# Patient Record
Sex: Male | Born: 1960 | Race: Black or African American | Hispanic: No | Marital: Married | Smoking: Never smoker
Health system: Southern US, Community
[De-identification: ages and names within clinical notes are randomized; demographics above are authoritative.]

## PROBLEM LIST (undated history)

## (undated) DIAGNOSIS — A159 Respiratory tuberculosis unspecified: Secondary | ICD-10-CM

## (undated) DIAGNOSIS — R7611 Nonspecific reaction to tuberculin skin test without active tuberculosis: Secondary | ICD-10-CM

## (undated) DIAGNOSIS — S62309A Unspecified fracture of unspecified metacarpal bone, initial encounter for closed fracture: Secondary | ICD-10-CM

## (undated) DIAGNOSIS — F419 Anxiety disorder, unspecified: Secondary | ICD-10-CM

## (undated) DIAGNOSIS — T7840XA Allergy, unspecified, initial encounter: Secondary | ICD-10-CM

## (undated) DIAGNOSIS — K219 Gastro-esophageal reflux disease without esophagitis: Secondary | ICD-10-CM

## (undated) DIAGNOSIS — I10 Essential (primary) hypertension: Secondary | ICD-10-CM

## (undated) HISTORY — DX: Essential (primary) hypertension: I10

## (undated) HISTORY — DX: Anxiety disorder, unspecified: F41.9

## (undated) HISTORY — DX: Allergy, unspecified, initial encounter: T78.40XA

---
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
## 2000-05-30 ENCOUNTER — Emergency Department (HOSPITAL_COMMUNITY): Admission: EM | Admit: 2000-05-30 | Discharge: 2000-05-30 | Payer: Self-pay | Admitting: Emergency Medicine

## 2012-08-12 ENCOUNTER — Ambulatory Visit (INDEPENDENT_AMBULATORY_CARE_PROVIDER_SITE_OTHER): Payer: BC Managed Care – PPO | Admitting: Emergency Medicine

## 2012-08-12 VITALS — BP 139/92 | HR 61 | Temp 98.4°F | Resp 16 | Ht 71.0 in | Wt 184.6 lb

## 2012-08-12 DIAGNOSIS — L0211 Cutaneous abscess of neck: Secondary | ICD-10-CM

## 2012-08-12 DIAGNOSIS — L03221 Cellulitis of neck: Secondary | ICD-10-CM

## 2012-08-12 MED ORDER — DOXYCYCLINE HYCLATE 100 MG PO CAPS: 100.0000 mg | ORAL_CAPSULE | Freq: Two times a day (BID) | ORAL | Status: DC

## 2012-08-12 NOTE — Progress Notes (Signed)
[facility] [address] [place] [address] [phone]  Date:  08/12/2012   Name:  Dillon Reeves   DOB:  03/24/1962   MRN:  [id number]  PCP:  No primary provider on file.    Chief Complaint: sore on back of neck   History of Present Illness:  Dillon Reeves is a 52 y.o. very pleasant male patient who presents with the following:  Numerous lesions on the back of his neck that have mostly responded to topical cleocin lotion.  Has one large remaining lesion on the back of his neck.  No drainage.  No fever or chills.  There is no problem list on file for this patient.   Past Medical History  Diagnosis Date  . Allergy   . Anxiety     History reviewed. No pertinent past surgical history.  History  Substance Use Topics  . Smoking status: Never Smoker   . Smokeless tobacco: Not on file  . Alcohol Use: Yes     Comment: once or twice every two weeks    Family History  Problem Relation Age of Onset  . Heart Problems Mother   . Anemia Mother   . Hypertension Brother     No Known Allergies  Medication list has been reviewed and updated.  No current outpatient prescriptions on file prior to visit.   No current facility-administered medications on file prior to visit.    Review of Systems:  As per HPI, otherwise negative.    Physical Examination: Filed Vitals:   08/12/12 1206  BP: 139/92  Pulse: 61  Temp: 98.4 F (36.9 C)  Resp: 16   Filed Vitals:   08/12/12 1206  Height: 5\' 11"  (1.803 m)  Weight: 184 lb 9.6 oz (83.734 kg)   Body mass index is 25.76 kg/(m^2). Ideal Body Weight: Weight in (lb) to have BMI = 25: 178.9   GEN: WDWN, NAD, Non-toxic, Alert & Oriented x 3 HEENT: Atraumatic, Normocephalic.  Ears and Nose: No external deformity. EXTR: No clubbing/cyanosis/edema NEURO: Normal gait.  PSYCH: Normally interactive. Conversant. Not depressed or anxious appearing.  Calm demeanor.  Crusted lesion on the back of  his neck with surrounding lesions.  Assessment and Plan:  Cellulitis neck Doxycycline Follow up 2 weeks  Carmelina Dane, MD

## 2012-08-12 NOTE — Patient Instructions (Signed)
Abscess An abscess is an infected area that contains a collection of pus and debris. It can occur in almost any part of the body. An abscess is also known as a furuncle or boil. CAUSES   An abscess occurs when tissue gets infected. This can occur from blockage of oil or sweat glands, infection of hair follicles, or a minor injury to the skin. As the body tries to fight the infection, pus collects in the area and creates pressure under the skin. This pressure causes pain. People with weakened immune systems have difficulty fighting infections and get certain abscesses more often.   SYMPTOMS Usually an abscess develops on the skin and becomes a painful mass that is red, warm, and tender. If the abscess forms under the skin, you may feel a moveable soft area under the skin. Some abscesses break open (rupture) on their own, but most will continue to get worse without care. The infection can spread deeper into the body and eventually into the bloodstream, causing you to feel ill.   DIAGNOSIS   Your caregiver will take your medical history and perform a physical exam. A sample of fluid may also be taken from the abscess to determine what is causing your infection. TREATMENT   Your caregiver may prescribe antibiotic medicines to fight the infection. However, taking antibiotics alone usually does not cure an abscess. Your caregiver may need to make a small cut (incision) in the abscess to drain the pus. In some cases, gauze is packed into the abscess to reduce pain and to continue draining the area. HOME CARE INSTRUCTIONS    Only take over-the-counter or prescription medicines for pain, discomfort, or fever as directed by your caregiver.   If you were prescribed antibiotics, take them as directed. Finish them even if you start to feel better.   If gauze is used, follow your caregiver's directions for changing the gauze.   To avoid spreading the infection:   Keep your draining abscess covered with a  bandage.   Wash your hands well.   Do not share personal care items, towels, or whirlpools with others.   Avoid skin contact with others.   Keep your skin and clothes clean around the abscess.   Keep all follow-up appointments as directed by your caregiver.  SEEK MEDICAL CARE IF:    You have increased pain, swelling, redness, fluid drainage, or bleeding.   You have muscle aches, chills, or a general ill feeling.   You have a fever.  MAKE SURE YOU:    Understand these instructions.   Will watch your condition.   Will get help right away if you are not doing well or get worse.  Document Released: 03/29/2005 Document Revised: 12/19/2011 Document Reviewed: 09/01/2011 [employer] Patient Information 2013 [employer].    

## 2013-07-28 ENCOUNTER — Telehealth: Payer: Self-pay | Admitting: Family Medicine

## 2013-07-28 MED ORDER — OSELTAMIVIR PHOSPHATE 75 MG PO CAPS: 75.0000 mg | ORAL_CAPSULE | Freq: Two times a day (BID) | ORAL | Status: DC

## 2013-07-28 NOTE — Telephone Encounter (Signed)
Patient with daughter at visit today; daughter tested + for influenza A; pt requesting Tamiflu due to history of double pneumonia.  Pt having sore throat.  A/P: likely influenza A: agreeable to Tamiflu.

## 2014-12-14 ENCOUNTER — Ambulatory Visit (INDEPENDENT_AMBULATORY_CARE_PROVIDER_SITE_OTHER): Payer: BC Managed Care – PPO | Admitting: Emergency Medicine

## 2014-12-14 VITALS — BP 112/80 | HR 70 | Temp 98.3°F | Resp 14 | Ht 71.0 in | Wt 183.0 lb

## 2014-12-14 DIAGNOSIS — H109 Unspecified conjunctivitis: Secondary | ICD-10-CM | POA: Diagnosis not present

## 2014-12-14 DIAGNOSIS — W57XXXA Bitten or stung by nonvenomous insect and other nonvenomous arthropods, initial encounter: Secondary | ICD-10-CM

## 2014-12-14 MED ORDER — POLYMYXIN B-TRIMETHOPRIM 10000-0.1 UNIT/ML-% OP SOLN: 2.0000 [drp] | OPHTHALMIC | Status: DC

## 2014-12-14 MED ORDER — TRIAMCINOLONE ACETONIDE 0.1 % EX CREA
1.0000 | TOPICAL_CREAM | Freq: Two times a day (BID) | CUTANEOUS | Status: DC
Start: 2014-12-14 — End: 2015-01-26

## 2014-12-14 NOTE — Progress Notes (Signed)
Subjective:  Patient ID: Dillon Reeves, male    DOB: January 29, 1961  Age: 54 y.o. MRN: [id number]  CC: Eye Problem and Insect Bite   HPI Dillon Reeves presents  with a 2 day history of redness and swelling in his eye. He has no history of trauma. Has no history of foreign body or foreign body sensation. He has no visual symptoms. All his symptoms involving left eye. Does have some discharge. And some gluing. He said his symptoms been stable and not worsening. He has no history of cough or coryza or fever or chills.  He complains of an exaggerated reaction to "mosquito bites" says that the site of the bite is red and swollen for a protracted period of time. No systemic symptoms such as shortness of breath wheezing or difficulty swallowing or facial swelling. Denies any improvement with over-the-counter medication.  Outpatient Prescriptions Prior to Visit  Medication Sig Dispense Refill  . clindamycin (CLEOCIN T) 1 % lotion Apply topically 2 (two) times daily.    Marland Kitchen doxycycline (VIBRAMYCIN) 100 MG capsule Take 1 capsule (100 mg total) by mouth 2 (two) times daily. (Patient not taking: Reported on 12/14/2014) 28 capsule 0  . Multiple Vitamins-Minerals (MULTIVITAMIN PO) Take 1 tablet by mouth daily.    Marland Kitchen oseltamivir (TAMIFLU) 75 MG capsule Take 1 capsule (75 mg total) by mouth 2 (two) times daily. (Patient not taking: Reported on 12/14/2014) 10 capsule 0   No facility-administered medications prior to visit.    History   Social History  . Marital Status: Single    Spouse Name: N/A  . Number of Children: N/A  . Years of Education: N/A   Social History Main Topics  . Smoking status: Never Smoker   . Smokeless tobacco: Not on file  . Alcohol Use: Yes     Comment: once or twice every two weeks  . Drug Use: No  . Sexual Activity: Yes   Other Topics Concern  . None   Social History Narrative    Family History  Problem Relation Age of Onset  . Heart Problems Mother   .  Anemia Mother   . Hypertension Brother     Past Medical History  Diagnosis Date  . Allergy   . Anxiety      Review of Systems  Constitutional: Negative for fever, chills and appetite change.  HENT: Negative for congestion, ear pain, postnasal drip, sinus pressure and sore throat.   Eyes: Positive for discharge and redness. Negative for pain.  Respiratory: Negative for cough, shortness of breath and wheezing.   Cardiovascular: Negative for leg swelling.  Gastrointestinal: Negative for nausea, vomiting, abdominal pain, diarrhea, constipation and blood in stool.  Endocrine: Negative for polyuria.  Genitourinary: Negative for dysuria, urgency, frequency and flank pain.  Musculoskeletal: Negative for gait problem.  Skin: Negative for rash.  Neurological: Negative for weakness and headaches.  Psychiatric/Behavioral: Negative for confusion and decreased concentration. The patient is not nervous/anxious.     Objective:  BP 112/80 mmHg  Pulse 70  Temp(Src) 98.3 F (36.8 C) (Oral)  Resp 14  Ht 5\' 11"  (1.803 m)  Wt 183 lb (83.008 kg)  BMI 25.53 kg/m2  SpO2 98%  BP Readings from Last 3 Encounters:  12/14/14 112/80  08/12/12 139/92    Wt Readings from Last 3 Encounters:  12/14/14 183 lb (83.008 kg)  08/12/12 184 lb 9.6 oz (83.734 kg)    Physical Exam  Constitutional: He is oriented to person, place, and time.  He appears well-developed and well-nourished.  HENT:  Head: Normocephalic and atraumatic.  Eyes: Pupils are equal, round, and reactive to light. Left eye exhibits discharge. Left eye exhibits no exudate. No foreign body present in the left eye. Left conjunctiva is injected. Left conjunctiva has no hemorrhage. Left eye exhibits normal extraocular motion. Left pupil is round and reactive.  Pulmonary/Chest: Effort normal.  Musculoskeletal: He exhibits no edema.  Neurological: He is alert and oriented to person, place, and time.  Skin: Skin is dry.  Psychiatric: He has a  normal mood and affect. His behavior is normal. Thought content normal.   fluorescein stain was negative and the eye there is no foreign body hyphema or hypopyon  No results found for: WBC, HGB, HCT, PLT, GLUCOSE, CHOL, TRIG, HDL, LDLDIRECT, LDLCALC, ALT, AST, NA, K, CL, CREATININE, BUN, CO2, TSH, PSA, INR, GLUF, HGBA1C, MICROALBUR    .  Assessment & Plan:   Dillon Reeves was seen today for eye problem and insect bite.  Diagnoses and all orders for this visit:  Conjunctivitis of left eye  Insect bite  Other orders -     trimethoprim-polymyxin b (POLYTRIM) ophthalmic solution; Place 2 drops into the left eye every 4 (four) hours. -     triamcinolone cream (KENALOG) 0.1 %; Apply 1 application topically 2 (two) times daily.   I am having Dillon Reeves start on trimethoprim-polymyxin b and triamcinolone cream. I am also having him maintain his Multiple Vitamins-Minerals (MULTIVITAMIN PO), clindamycin, doxycycline, and oseltamivir.  Meds ordered this encounter  Medications  . trimethoprim-polymyxin b (POLYTRIM) ophthalmic solution    Sig: Place 2 drops into the left eye every 4 (four) hours.    Dispense:  10 mL    Refill:  0  . triamcinolone cream (KENALOG) 0.1 %    Sig: Apply 1 application topically 2 (two) times daily.    Dispense:  30 g    Refill:  1    Appropriate red flag conditions were discussed with the patient as well as actions that should be taken.  Patient expressed his understanding.  Follow-up: Return if symptoms worsen or fail to improve.  Roselee Culver, MD

## 2014-12-14 NOTE — Patient Instructions (Signed)

## 2014-12-16 ENCOUNTER — Telehealth: Payer: Self-pay

## 2014-12-16 NOTE — Telephone Encounter (Signed)
Pt states he was diagnosed with pink eye and have been taking the medicine faithfully but doesn't see any improvement in his eyes, would like to know what the recommendation is now Please call [phone]

## 2014-12-17 MED ORDER — OFLOXACIN 0.3 % OP SOLN: 1.0000 [drp] | OPHTHALMIC | Status: DC

## 2014-12-17 NOTE — Telephone Encounter (Signed)
Spoke with pt, advised message from Mario. Pt understood. 

## 2014-12-17 NOTE — Telephone Encounter (Signed)
Please let patient know that this means it could be viral conjunctivitis. If his symptoms have worsened, return to clinic. If not, we can try Ocuflox for 1 day and if that doesn't work he needs to return to clinic for re-evaluation. Ocuflox is to be used 1 every 2 hours. She is to stop the other eye drops. Thank you!

## 2015-01-18 ENCOUNTER — Ambulatory Visit (INDEPENDENT_AMBULATORY_CARE_PROVIDER_SITE_OTHER): Payer: BC Managed Care – PPO

## 2015-01-18 ENCOUNTER — Encounter: Payer: Self-pay | Admitting: Emergency Medicine

## 2015-01-18 ENCOUNTER — Ambulatory Visit (INDEPENDENT_AMBULATORY_CARE_PROVIDER_SITE_OTHER): Payer: BC Managed Care – PPO | Admitting: Emergency Medicine

## 2015-01-18 VITALS — BP 130/82 | HR 66 | Temp 95.5°F | Resp 16 | Ht 71.0 in | Wt 184.0 lb

## 2015-01-18 DIAGNOSIS — S62309A Unspecified fracture of unspecified metacarpal bone, initial encounter for closed fracture: Secondary | ICD-10-CM | POA: Diagnosis not present

## 2015-01-18 DIAGNOSIS — M79641 Pain in right hand: Secondary | ICD-10-CM | POA: Diagnosis not present

## 2015-01-18 MED ORDER — HYDROCODONE-ACETAMINOPHEN 5-325 MG PO TABS: 1.0000 | ORAL_TABLET | ORAL | Status: DC | PRN

## 2015-01-18 NOTE — Patient Instructions (Signed)
Hand Fracture, Fifth Metacarpal The small metacarpal is the bone at the base of the little finger between the knuckle and the wrist. A fracture is a break in that bone. One of the fractures that is common to this bone is called a Boxer's Fracture. TREATMENT These fractures can be treated with:   Reduction (bones moved back into place), then pinned through the skin to maintain the position, and then casted for about 6 weeks or as your caregiver determines necessary.  ORIF (open reduction and internal fixation) - the fracture site is opened and the bone pieces are fixed into place with pins and then casted for approximately 6 weeks or as your caregiver determines necessary. Your caregiver will discuss the type of fracture you have and the treatment that should be best for that problem. If surgery is the treatment of choice, the following is information for you to know, and also let your caregiver know about prior to surgery.  LET YOUR CAREGIVER KNOW ABOUT:  Allergies.  Medications taken including herbs, eye drops, over the counter medications, and creams.  Use of steroids (by mouth or creams).  Previous problems with anesthetics or novocaine.  Possibility of pregnancy, if this applies.  History of blood clots (thrombophlebitis).  History of bleeding or blood problems.  Previous surgery.  Other health problems. AFTER THE PROCEDURE After surgery, you will be taken to the recovery area where a nurse will watch and check your progress. Once you're awake, stable, and taking fluids well, barring other problems you'll be allowed to go home. Once home an ice pack applied to your operative site may help with discomfort and keep the swelling down. HOME CARE INSTRUCTIONS   Follow your caregiver's instructions as to activities, exercises, physical therapy, and driving a car.  Daily exercise is helpful for maintaining range of motion (movement and mobility) and strength. Exercise as  instructed.  To lessen swelling, keep the injured hand elevated above the level of your heart as much as possible.  Apply ice to the injury for 15-20 minutes each hour while awake for the first 2 days. Put the ice in a plastic bag and place a thin towel between the bag of ice and your cast.  Move the fingers of your casted hand at least several times a day.  If a plaster or fiberglass cast was applied:  Do not try to scratch the skin under the cast using a sharp or pointed object.  Check the skin around the cast every day. You may put lotion on red or sore areas.  Keep your cast dry. Your cast can be protected during bathing with a plastic bag. Do not put your cast into the water.  If a plaster splint was applied:  Wear the splint for as long as directed by your caregiver or until seen for follow-up examination.  Do not get your splint wet. Protect it during bathing with a plastic bag.  You may loosen the elastic bandage around the splint if your fingers start to get numb, tingle, get cold or turn blue.  Do not put pressure on your cast or splint; this may cause it to break. Especially, do not lean plaster casts on hard surfaces for 24 hours after application.  Take medications as directed by your caregiver.  Only take over-the-counter or prescription medicines for pain, discomfort, or fever as directed by your caregiver.  Follow all instructions for physician referrals, physical therapy, and rehabilitation. Any delay in obtaining necessary care could result in  permanent injury, disability and chronic pain. SEEK MEDICAL CARE IF:   Increased bleeding (more than a small spot) from the wound or from beneath your cast or splint if there is a wound beneath the cast from surgery.  Redness, swelling, or increasing pain in the wound or from beneath your cast or splint.  Pus coming from wound or from beneath your cast or splint.  An unexplained oral temperature above 102 F (38.9 C)  develops.  A foul smell coming from the wound or dressing or from beneath your cast or splint.  You are unable to move your little finger. SEEK IMMEDIATE MEDICAL CARE IF:  You develop a rash, have difficulty breathing, or have any allergy problems. If you do not have a window in your cast for observing the wound, a discharge or minor bleeding may show up as a stain on the outside of your cast. Report these findings to your caregiver. MAKE SURE YOU:   Understand these instructions.  Will watch your condition.  Will get help right away if you are not doing well or get worse. Document Released: 09/25/2000 Document Revised: 09/11/2011 Document Reviewed: 02/06/2008 [facility] Patient Information 2015 [place], [place]. This information is not intended to replace advice given to you by your health care provider. Make sure you discuss any questions you have with your health care provider.
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
## 2015-01-20 ENCOUNTER — Other Ambulatory Visit: Payer: Self-pay | Admitting: Orthopedic Surgery

## 2015-01-26 ENCOUNTER — Encounter (HOSPITAL_BASED_OUTPATIENT_CLINIC_OR_DEPARTMENT_OTHER): Payer: Self-pay | Admitting: *Deleted
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
## 2015-01-29 ENCOUNTER — Ambulatory Visit (HOSPITAL_BASED_OUTPATIENT_CLINIC_OR_DEPARTMENT_OTHER)
Admission: RE | Admit: 2015-01-29 | Discharge: 2015-01-29 | Disposition: A | Discharge status: Home / Self Care | Payer: BC Managed Care – PPO | Source: Ambulatory Visit | Attending: Orthopedic Surgery | Admitting: Orthopedic Surgery

## 2015-01-29 ENCOUNTER — Encounter (HOSPITAL_BASED_OUTPATIENT_CLINIC_OR_DEPARTMENT_OTHER)
Admission: RE | Disposition: A | Discharge status: Home / Self Care | Payer: Self-pay | Source: Ambulatory Visit | Attending: Orthopedic Surgery

## 2015-01-29 ENCOUNTER — Encounter (HOSPITAL_BASED_OUTPATIENT_CLINIC_OR_DEPARTMENT_OTHER): Payer: Self-pay | Admitting: *Deleted

## 2015-01-29 ENCOUNTER — Ambulatory Visit (HOSPITAL_BASED_OUTPATIENT_CLINIC_OR_DEPARTMENT_OTHER): Payer: BC Managed Care – PPO | Admitting: Anesthesiology

## 2015-01-29 ENCOUNTER — Ambulatory Visit (HOSPITAL_COMMUNITY): Payer: BC Managed Care – PPO

## 2015-01-29 DIAGNOSIS — F419 Anxiety disorder, unspecified: Secondary | ICD-10-CM | POA: Diagnosis present

## 2015-01-29 DIAGNOSIS — Y33XXXA Other specified events, undetermined intent, initial encounter: Secondary | ICD-10-CM | POA: Diagnosis not present

## 2015-01-29 DIAGNOSIS — S62316A Displaced fracture of base of fifth metacarpal bone, right hand, initial encounter for closed fracture: Secondary | ICD-10-CM | POA: Insufficient documentation

## 2015-01-29 DIAGNOSIS — Z419 Encounter for procedure for purposes other than remedying health state, unspecified: Secondary | ICD-10-CM

## 2015-01-29 DIAGNOSIS — S62306A Unspecified fracture of fifth metacarpal bone, right hand, initial encounter for closed fracture: Secondary | ICD-10-CM | POA: Diagnosis present

## 2015-01-29 HISTORY — PX: OPEN REDUCTION INTERNAL FIXATION (ORIF) HAND: SHX5991

## 2015-01-29 HISTORY — DX: Unspecified fracture of unspecified metacarpal bone, initial encounter for closed fracture: S62.309A

## 2015-01-29 SURGERY — OPEN REDUCTION INTERNAL FIXATION (ORIF) HAND
Anesthesia: General | Site: Hand | Laterality: Right

## 2015-01-29 MED ORDER — OXYCODONE HCL 5 MG PO TABS
5.0000 mg | ORAL_TABLET | Freq: Once | ORAL | Status: AC
Start: 2015-01-29 — End: 2015-01-29
  Administered 2015-01-29: 5 mg via ORAL

## 2015-01-29 MED ORDER — 0.9 % SODIUM CHLORIDE (POUR BTL) OPTIME
TOPICAL | Status: DC | PRN
  Administered 2015-01-29: 200 mL

## 2015-01-29 MED ORDER — FENTANYL CITRATE (PF) 100 MCG/2ML IJ SOLN
INTRAMUSCULAR | Status: AC
  Filled 2015-01-29: qty 2

## 2015-01-29 MED ORDER — BUPIVACAINE-EPINEPHRINE 0.5% -1:200000 IJ SOLN
INTRAMUSCULAR | Status: DC | PRN
  Administered 2015-01-29: 10 mL

## 2015-01-29 MED ORDER — PROPOFOL 10 MG/ML IV BOLUS
INTRAVENOUS | Status: DC | PRN
Start: 2015-01-29 — End: 2015-01-29
  Administered 2015-01-29: 200 mg via INTRAVENOUS

## 2015-01-29 MED ORDER — GLYCOPYRROLATE 0.2 MG/ML IJ SOLN: 0.2000 mg | Freq: Once | INTRAMUSCULAR | Status: DC | PRN

## 2015-01-29 MED ORDER — ONDANSETRON HCL 4 MG/2ML IJ SOLN
INTRAMUSCULAR | Status: DC | PRN
  Administered 2015-01-29: 4 mg via INTRAVENOUS

## 2015-01-29 MED ORDER — FENTANYL CITRATE (PF) 100 MCG/2ML IJ SOLN
INTRAMUSCULAR | Status: AC
  Filled 2015-01-29: qty 4

## 2015-01-29 MED ORDER — FENTANYL CITRATE (PF) 100 MCG/2ML IJ SOLN
50.0000 ug | INTRAMUSCULAR | Status: DC | PRN
  Administered 2015-01-29: 100 ug via INTRAVENOUS

## 2015-01-29 MED ORDER — MIDAZOLAM HCL 2 MG/2ML IJ SOLN
1.0000 mg | INTRAMUSCULAR | Status: DC | PRN
Start: 2015-01-29 — End: 2015-01-29
  Administered 2015-01-29: 2 mg via INTRAVENOUS

## 2015-01-29 MED ORDER — LABETALOL HCL 5 MG/ML IV SOLN
5.0000 mg | INTRAVENOUS | Status: DC | PRN
  Administered 2015-01-29: 5 mg via INTRAVENOUS

## 2015-01-29 MED ORDER — LACTATED RINGERS IV SOLN: INTRAVENOUS | Status: DC

## 2015-01-29 MED ORDER — HYDROMORPHONE HCL 1 MG/ML IJ SOLN
0.2500 mg | INTRAMUSCULAR | Status: DC | PRN
  Administered 2015-01-29 (×4): 0.5 mg via INTRAVENOUS

## 2015-01-29 MED ORDER — SCOPOLAMINE 1 MG/3DAYS TD PT72: 1.0000 | MEDICATED_PATCH | Freq: Once | TRANSDERMAL | Status: DC | PRN

## 2015-01-29 MED ORDER — HYDROMORPHONE HCL 1 MG/ML IJ SOLN
INTRAMUSCULAR | Status: AC
  Filled 2015-01-29: qty 1

## 2015-01-29 MED ORDER — LIDOCAINE HCL (PF) 1 % IJ SOLN
INTRAMUSCULAR | Status: AC
  Filled 2015-01-29: qty 30

## 2015-01-29 MED ORDER — LABETALOL HCL 5 MG/ML IV SOLN
INTRAVENOUS | Status: AC
  Filled 2015-01-29: qty 4

## 2015-01-29 MED ORDER — LACTATED RINGERS IV SOLN
INTRAVENOUS | Status: DC
  Administered 2015-01-29 (×2): via INTRAVENOUS

## 2015-01-29 MED ORDER — DEXAMETHASONE SODIUM PHOSPHATE 10 MG/ML IJ SOLN
INTRAMUSCULAR | Status: DC | PRN
  Administered 2015-01-29: 10 mg via INTRAVENOUS

## 2015-01-29 MED ORDER — MIDAZOLAM HCL 2 MG/2ML IJ SOLN
INTRAMUSCULAR | Status: AC
  Filled 2015-01-29: qty 2

## 2015-01-29 MED ORDER — CEFAZOLIN SODIUM-DEXTROSE 2-3 GM-% IV SOLR
2.0000 g | INTRAVENOUS | Status: AC
  Administered 2015-01-29: 2 g via INTRAVENOUS

## 2015-01-29 MED ORDER — LIDOCAINE HCL (CARDIAC) 20 MG/ML IV SOLN
INTRAVENOUS | Status: DC | PRN
  Administered 2015-01-29: 70 mg via INTRAVENOUS

## 2015-01-29 MED ORDER — KETOROLAC TROMETHAMINE 30 MG/ML IJ SOLN
INTRAMUSCULAR | Status: DC | PRN
  Administered 2015-01-29: 30 mg via INTRAVENOUS

## 2015-01-29 MED ORDER — OXYCODONE-ACETAMINOPHEN 5-325 MG PO TABS: 1.0000 | ORAL_TABLET | Freq: Four times a day (QID) | ORAL | Status: DC | PRN

## 2015-01-29 MED ORDER — OXYCODONE HCL 5 MG PO TABS
ORAL_TABLET | ORAL | Status: AC
  Filled 2015-01-29: qty 1

## 2015-01-29 MED ORDER — BUPIVACAINE-EPINEPHRINE (PF) 0.5% -1:200000 IJ SOLN
INTRAMUSCULAR | Status: AC
  Filled 2015-01-29: qty 30

## 2015-01-29 SURGICAL SUPPLY — 49 items
BLADE MINI RND TIP GREEN BEAV (BLADE) IMPLANT
BLADE SURG 15 STRL LF DISP TIS (BLADE) ×1 IMPLANT
BLADE SURG 15 STRL SS (BLADE) ×3
BNDG CMPR 9X4 STRL LF SNTH (GAUZE/BANDAGES/DRESSINGS) ×1
BNDG COHESIVE 4X5 TAN STRL (GAUZE/BANDAGES/DRESSINGS) ×3 IMPLANT
BNDG ESMARK 4X9 LF (GAUZE/BANDAGES/DRESSINGS) ×3 IMPLANT
BNDG GAUZE ELAST 4 BULKY (GAUZE/BANDAGES/DRESSINGS) ×6 IMPLANT
CANISTER SUCTION 1200CC (MISCELLANEOUS) ×2 IMPLANT
CHLORAPREP W/TINT 26ML (MISCELLANEOUS) ×3 IMPLANT
CORDS BIPOLAR (ELECTRODE) ×3 IMPLANT
COVER BACK TABLE 60X90IN (DRAPES) ×3 IMPLANT
COVER MAYO STAND STRL (DRAPES) ×3 IMPLANT
CUFF TOURNIQUET SINGLE 18IN (TOURNIQUET CUFF) ×3 IMPLANT
DRAPE C-ARM 42X72 X-RAY (DRAPES) ×3 IMPLANT
DRAPE EXTREMITY T 121X128X90 (DRAPE) ×3 IMPLANT
DRAPE SURG 17X23 STRL (DRAPES) ×3 IMPLANT
DRSG EMULSION OIL 3X3 NADH (GAUZE/BANDAGES/DRESSINGS) ×3 IMPLANT
GAUZE SPONGE 4X4 12PLY STRL (GAUZE/BANDAGES/DRESSINGS) ×3 IMPLANT
GLOVE BIO SURGEON STRL SZ7.5 (GLOVE) ×3 IMPLANT
GLOVE BIOGEL PI IND STRL 7.0 (GLOVE) ×1 IMPLANT
GLOVE BIOGEL PI IND STRL 8 (GLOVE) ×1 IMPLANT
GLOVE BIOGEL PI INDICATOR 7.0 (GLOVE) ×2
GLOVE BIOGEL PI INDICATOR 8 (GLOVE) ×2
GLOVE ECLIPSE 6.5 STRL STRAW (GLOVE) ×3 IMPLANT
GOWN STRL REUS W/ TWL LRG LVL3 (GOWN DISPOSABLE) ×2 IMPLANT
GOWN STRL REUS W/TWL LRG LVL3 (GOWN DISPOSABLE) ×6
GOWN STRL REUS W/TWL XL LVL3 (GOWN DISPOSABLE) ×3 IMPLANT
INSTRUMENT SOFT TISSUE RELEASE (INSTRUMENTS) ×4 IMPLANT
NDL HYPO 25X1 1.5 SAFETY (NEEDLE) IMPLANT
NEEDLE HYPO 25X1 1.5 SAFETY (NEEDLE) ×3 IMPLANT
NS IRRIG 1000ML POUR BTL (IV SOLUTION) ×3 IMPLANT
PACK BASIN DAY SURGERY FS (CUSTOM PROCEDURE TRAY) ×3 IMPLANT
PADDING CAST ABS 4INX4YD NS (CAST SUPPLIES)
PADDING CAST ABS COTTON 4X4 ST (CAST SUPPLIES) IMPLANT
SLEEVE SCD COMPRESS KNEE MED (MISCELLANEOUS) IMPLANT
SPLINT PLASTER CAST XFAST 3X15 (CAST SUPPLIES) ×1 IMPLANT
SPLINT PLASTER XTRA FASTSET 3X (CAST SUPPLIES) ×2
STOCKINETTE 6  STRL (DRAPES) ×2
STOCKINETTE 6 STRL (DRAPES) ×1 IMPLANT
SUCTION FRAZIER TIP 10 FR DISP (SUCTIONS) ×2 IMPLANT
SUT VICRYL RAPIDE 4-0 (SUTURE) ×2 IMPLANT
SUT VICRYL RAPIDE 4/0 PS 2 (SUTURE) IMPLANT
SYR BULB 3OZ (MISCELLANEOUS) ×3 IMPLANT
SYRINGE 10CC LL (SYRINGE) ×3 IMPLANT
TOWEL OR 17X24 6PK STRL BLUE (TOWEL DISPOSABLE) ×3 IMPLANT
TOWEL OR NON WOVEN STRL DISP B (DISPOSABLE) ×3 IMPLANT
TUBE CONNECTING 20'X1/4 (TUBING) ×1
TUBE CONNECTING 20X1/4 (TUBING) ×1 IMPLANT
UNDERPAD 30X30 (UNDERPADS AND DIAPERS) ×3 IMPLANT
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
## 2015-01-29 NOTE — Anesthesia Postprocedure Evaluation (Signed)
  Anesthesia Post-op Note  Patient: Dillon Reeves  Procedure(s) Performed: Procedure(s): OPEN TREATMENT RIGHT FIFTH METACARPAL FRACTURE (Right)  Patient Location: PACU  Anesthesia Type:General  Level of Consciousness: awake and alert   Airway and Oxygen Therapy: Patient Spontanous Breathing  Post-op Pain: Controlled  Post-op Assessment: Post-op Vital signs reviewed, Patient's Cardiovascular Status Stable and Respiratory Function Stable  Post-op Vital Signs: Reviewed  Filed Vitals:   01/29/15 1715  BP: 154/96  Pulse: 76  Temp:   Resp: 9    Complications: No apparent anesthesia complications

## 2015-01-29 NOTE — Interval H&P Note (Signed)
History and Physical Interval Note:  01/29/2015 3:09 PM  Dillon Reeves  has presented today for surgery, with the diagnosis of RIGHT 5TH METACARPAL BASE FRACTURE S62.316A  The various methods of treatment have been discussed with the patient and family. After consideration of risks, benefits and other options for treatment, the patient has consented to  Procedure(s): OPEN TREATMENT (Right) as a surgical intervention .  The patient's history has been reviewed, patient examined, no change in status, stable for surgery.  I have reviewed the patient's chart and labs.  Questions were answered to the patient's satisfaction.     [name] A.

## 2015-01-29 NOTE — Discharge Instructions (Addendum)
Discharge Instructions   You have a dressing with a plaster splint incorporated in it. Move your fingers as much as possible, making a full fist and fully opening the fist. Elevate your hand to reduce pain & swelling of the digits.  Ice over the operative site may be helpful to reduce pain & swelling.  DO NOT USE HEAT. Pain medicine has been prescribed for you.  Use your medicine as needed over the first 48 hours, and then you can begin to taper your use.  You may use Tylenol in place of your prescribed pain medication, but not IN ADDITION to it. Leave the dressing in place until you return to our office.  You may shower, but keep the bandage clean & dry.  You may drive a car when you are off of prescription pain medications and can safely control your vehicle with both hands.   Please call [phone] during normal business hours or [phone] after hours for any problems. Including the following:  - excessive redness of the incisions - drainage for more than 4 days - fever of more than 101.5 F  *Please note that pain medications will not be refilled after hours or on weekends.    Post Anesthesia Home Care Instructions  Activity: Get plenty of rest for the remainder of the day. A responsible adult should stay with you for 24 hours following the procedure.  For the next 24 hours, DO NOT: -Drive a car -Paediatric nurse -Drink alcoholic beverages -Take any medication unless instructed by your physician -Make any legal decisions or sign important papers.  Meals: Start with liquid foods such as gelatin or soup. Progress to regular foods as tolerated. Avoid greasy, spicy, heavy foods. If nausea and/or vomiting occur, drink only clear liquids until the nausea and/or vomiting subsides. Call your physician if vomiting continues.  Special Instructions/Symptoms: Your throat may feel dry or sore from the anesthesia or the breathing tube placed in your throat during surgery. If this  causes discomfort, gargle with warm salt water. The discomfort should disappear within 24 hours.  If you had a scopolamine patch placed behind your ear for the management of post- operative nausea and/or vomiting:  1. The medication in the patch is effective for 72 hours, after which it should be removed.  Wrap patch in a tissue and discard in the trash. Wash hands thoroughly with soap and water. 2. You may remove the patch earlier than 72 hours if you experience unpleasant side effects which may include dry mouth, dizziness or visual disturbances. 3. Avoid touching the patch. Wash your hands with soap and water after contact with the patch.

## 2015-01-29 NOTE — Anesthesia Procedure Notes (Signed)
Procedure Name: LMA Insertion Date/Time: 01/29/2015 3:19 PM Performed by: Maryella Shivers Pre-anesthesia Checklist: Patient identified, Emergency Drugs available, Suction available and Patient being monitored Patient Re-evaluated:Patient Re-evaluated prior to inductionOxygen Delivery Method: Circle System Utilized Preoxygenation: Pre-oxygenation with 100% oxygen Intubation Type: IV induction Ventilation: Mask ventilation without difficulty LMA: LMA inserted LMA Size: 5.0 Number of attempts: 1 Airway Equipment and Method: bite block Placement Confirmation: positive ETCO2 Tube secured with: Tape Dental Injury: Teeth and Oropharynx as per pre-operative assessment

## 2015-01-29 NOTE — Transfer of Care (Signed)
Immediate Anesthesia Transfer of Care Note  Patient: Dillon Reeves  Procedure(s) Performed: Procedure(s): OPEN TREATMENT RIGHT FIFTH METACARPAL FRACTURE (Right)  Patient Location: PACU  Anesthesia Type:General  Level of Consciousness: awake, alert  and patient cooperative  Airway & Oxygen Therapy: Patient Spontanous Breathing and Patient connected to face mask oxygen  Post-op Assessment: Report given to RN, Post -op Vital signs reviewed and stable and Patient moving all extremities  Post vital signs: Reviewed and stable  Last Vitals:  Filed Vitals:   01/29/15 1349  BP: 153/97  Pulse: 65  Temp: 36.7 C  Resp: 20    Complications: No apparent anesthesia complications

## 2015-01-29 NOTE — Anesthesia Preprocedure Evaluation (Addendum)
Anesthesia Evaluation  Patient identified by MRN, date of birth, ID band Patient awake    Reviewed: Allergy & Precautions, H&P , NPO status , Patient's Chart, lab work & pertinent test results  Airway Mallampati: I  TM Distance: >3 FB Neck ROM: Full    Dental no notable dental hx. (+) Teeth Intact, Dental Advisory Given   Pulmonary neg pulmonary ROS,  breath sounds clear to auscultation  Pulmonary exam normal       Cardiovascular negative cardio ROS  Rhythm:Regular Rate:Normal     Neuro/Psych Anxiety negative neurological ROS  negative psych ROS   GI/Hepatic negative GI ROS, Neg liver ROS,   Endo/Other  negative endocrine ROS  Renal/GU negative Renal ROS  negative genitourinary   Musculoskeletal   Abdominal   Peds  Hematology negative hematology ROS (+)   Anesthesia Other Findings   Reproductive/Obstetrics negative OB ROS                            Anesthesia Physical Anesthesia Plan  ASA: II  Anesthesia Plan: General   Post-op Pain Management:    Induction: Intravenous  Airway Management Planned: LMA  Additional Equipment:   Intra-op Plan:   Post-operative Plan: Extubation in OR  Informed Consent: I have reviewed the patients History and Physical, chart, labs and discussed the procedure including the risks, benefits and alternatives for the proposed anesthesia with the patient or authorized representative who has indicated his/her understanding and acceptance.   Dental advisory given  Plan Discussed with: CRNA  Anesthesia Plan Comments:         Anesthesia Quick Evaluation

## 2015-02-01 ENCOUNTER — Encounter (HOSPITAL_BASED_OUTPATIENT_CLINIC_OR_DEPARTMENT_OTHER): Payer: Self-pay | Admitting: Orthopedic Surgery

## 2016-04-24 ENCOUNTER — Other Ambulatory Visit: Payer: Self-pay | Admitting: Infectious Disease

## 2016-04-24 ENCOUNTER — Ambulatory Visit
Admission: RE | Admit: 2016-04-24 | Discharge: 2016-04-24 | Disposition: A | Discharge status: Home / Self Care | Payer: No Typology Code available for payment source | Source: Ambulatory Visit | Attending: Infectious Disease | Admitting: Infectious Disease

## 2016-04-24 DIAGNOSIS — Z111 Encounter for screening for respiratory tuberculosis: Secondary | ICD-10-CM

## 2016-07-03 DIAGNOSIS — R7611 Nonspecific reaction to tuberculin skin test without active tuberculosis: Secondary | ICD-10-CM

## 2016-07-03 DIAGNOSIS — A159 Respiratory tuberculosis unspecified: Secondary | ICD-10-CM

## 2016-07-03 HISTORY — DX: Respiratory tuberculosis unspecified: A15.9

## 2016-07-03 HISTORY — DX: Nonspecific reaction to tuberculin skin test without active tuberculosis: R76.11

## 2016-09-28 ENCOUNTER — Ambulatory Visit (INDEPENDENT_AMBULATORY_CARE_PROVIDER_SITE_OTHER): Payer: BC Managed Care – PPO | Admitting: Physician Assistant

## 2016-09-28 VITALS — BP 113/76 | HR 82 | Temp 99.3°F | Resp 18 | Ht 71.0 in | Wt 174.8 lb

## 2016-09-28 DIAGNOSIS — R7611 Nonspecific reaction to tuberculin skin test without active tuberculosis: Secondary | ICD-10-CM

## 2016-09-28 DIAGNOSIS — B349 Viral infection, unspecified: Secondary | ICD-10-CM

## 2016-09-28 HISTORY — DX: Nonspecific reaction to tuberculin skin test without active tuberculosis: R76.11

## 2016-09-28 MED ORDER — BENZONATATE 100 MG PO CAPS: 100.0000 mg | ORAL_CAPSULE | Freq: Three times a day (TID) | ORAL | 0 refills | Status: DC | PRN

## 2016-09-28 MED ORDER — GUAIFENESIN ER 1200 MG PO TB12: 1.0000 | ORAL_TABLET | Freq: Two times a day (BID) | ORAL | 1 refills | Status: DC | PRN

## 2016-09-28 MED ORDER — OSELTAMIVIR PHOSPHATE 75 MG PO CAPS: 75.0000 mg | ORAL_CAPSULE | Freq: Two times a day (BID) | ORAL | 0 refills | Status: DC

## 2016-09-28 MED ORDER — AZELASTINE HCL 0.15 % NA SOLN: 2.0000 | Freq: Two times a day (BID) | NASAL | 0 refills | Status: DC

## 2016-09-28 NOTE — Patient Instructions (Addendum)
   IF you received an x-ray today, you will receive an invoice from [facility]. Please contact [facility] at [phone] with questions or concerns regarding your invoice.   IF you received labwork today, you will receive an invoice from [facility]. Please contact [facility] at [phone] with questions or concerns regarding your invoice.   Our billing staff will not be able to assist you with questions regarding bills from these companies.  You will be contacted with the lab results as soon as they are available. The fastest way to get your results is to activate your My Chart account. Instructions are located on the last page of this paperwork. If you have not heard from us regarding the results in 2 weeks, please contact this office.      Influenza, Adult Influenza, more commonly known as "the flu," is a viral infection that primarily affects the respiratory tract. The respiratory tract includes organs that help you breathe, such as the lungs, nose, and throat. The flu causes many common cold symptoms, as well as a high fever and body aches. The flu spreads easily from person to person (is contagious). Getting a flu shot (influenza vaccination) every year is the best way to prevent influenza. What are the causes? Influenza is caused by a virus. You can catch the virus by:  Breathing in droplets from an infected person's cough or sneeze.  Touching something that was recently contaminated with the virus and then touching your mouth, nose, or eyes.  What increases the risk? The following factors may make you more likely to get the flu:  Not cleaning your hands frequently with soap and water or alcohol-based hand sanitizer.  Having close contact with many people during cold and flu season.  Touching your mouth, eyes, or nose without washing or sanitizing your hands first.  Not drinking enough fluids or not eating a healthy diet.  Not getting enough sleep or  exercise.  Being under a high amount of stress.  Not getting a yearly (annual) flu shot.  You may be at a higher risk of complications from the flu, such as a severe lung infection (pneumonia), if you:  Are over the age of 65.  Are pregnant.  Have a weakened disease-fighting system (immune system). You may have a weakened immune system if you: ? Have HIV or AIDS. ? Are undergoing chemotherapy. ? Aretaking medicines that reduce the activity of (suppress) the immune system.  Have a long-term (chronic) illness, such as heart disease, kidney disease, diabetes, or lung disease.  Have a liver disorder.  Are obese.  Have anemia.  What are the signs or symptoms? Symptoms of this condition typically last 4-10 days and may include:  Fever.  Chills.  Headache, body aches, or muscle aches.  Sore throat.  Cough.  Runny or congested nose.  Chest discomfort and cough.  Poor appetite.  Weakness or tiredness (fatigue).  Dizziness.  Nausea or vomiting.  How is this diagnosed? This condition may be diagnosed based on your medical history and a physical exam. Your health care provider may do a nose or throat swab test to confirm the diagnosis. How is this treated? If influenza is detected early, you can be treated with antiviral medicine that can reduce the length of your illness and the severity of your symptoms. This medicine may be given by mouth (orally) or through an IV tube that is inserted in one of your veins. The goal of treatment is to relieve symptoms by taking   care of yourself at home. This may include taking over-the-counter medicines, drinking plenty of fluids, and adding humidity to the air in your home. In some cases, influenza goes away on its own. Severe influenza or complications from influenza may be treated in a hospital. Follow these instructions at home:  Take over-the-counter and prescription medicines only as told by your health care provider.  Use a  cool mist humidifier to add humidity to the air in your home. This can make breathing easier.  Rest as needed.  Drink enough fluid to keep your urine clear or pale yellow.  Cover your mouth and nose when you cough or sneeze.  Wash your hands with soap and water often, especially after you cough or sneeze. If soap and water are not available, use hand sanitizer.  Stay home from work or school as told by your health care provider. Unless you are visiting your health care provider, try to avoid leaving home until your fever has been gone for 24 hours without the use of medicine.  Keep all follow-up visits as told by your health care provider. This is important. How is this prevented?  Getting an annual flu shot is the best way to avoid getting the flu. You may get the flu shot in late summer, fall, or winter. Ask your health care provider when you should get your flu shot.  Wash your hands often or use hand sanitizer often.  Avoid contact with people who are sick during cold and flu season.  Eat a healthy diet, drink plenty of fluids, get enough sleep, and exercise regularly. Contact a health care provider if:  You develop new symptoms.  You have: ? Chest pain. ? Diarrhea. ? A fever.  Your cough gets worse.  You produce more mucus.  You feel nauseous or you vomit. Get help right away if:  You develop shortness of breath or difficulty breathing.  Your skin or nails turn a bluish color.  You have severe pain or stiffness in your neck.  You develop a sudden headache or sudden pain in your face or ear.  You cannot stop vomiting. This information is not intended to replace advice given to you by your health care provider. Make sure you discuss any questions you have with your health care provider. Document Released: 06/16/2000 Document Revised: 11/25/2015 Document Reviewed: 04/13/2015 Elsevier Interactive Patient Education  2017 Elsevier Inc.  

## 2016-09-28 NOTE — Progress Notes (Signed)
Patient ID: Dillon Reeves, male    DOB: 1961-04-18, 56 y.o.   MRN: [id number]  PCP: No PCP Per Patient  Chief Complaint  Patient presents with  . Fever    x 2days   . Chills  . Headache  . Nasal Congestion  . Cough    sometimes has mucus     Subjective:   Presents for evaluation of possible influenza.  Sudden onset on Tuesday. No nausea/vomiting. Tmax 100.5  No flu vaccine this season. 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 Last weekend, was around someone with these same symptoms x 1 week.  No diarrhea. Chronic back pain, but no other myalgias/arthralgias. No rash.  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    Review of Systems As above.    Patient Active Problem List   Diagnosis Date Noted  . 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 Authorizing Provider  rifampin (RIFADIN) 300 MG capsule Take by mouth 2 (two) times daily.   Yes Historical Provider, MD     No Known Allergies     Objective:  Physical Exam  Constitutional: He is oriented to person, place, and time. He appears well-developed and well-nourished. He is active and cooperative. No distress.  BP 113/76   Pulse 82   Temp 99.3 F (37.4 C) (Oral)   Resp 18   Ht 5\' 11"  (1.803 m)   Wt 174 lb 12.8 oz (79.3 kg)   SpO2 96%   BMI 24.38 kg/m    HENT:  Head: Normocephalic and atraumatic.  Right Ear: Hearing, tympanic membrane, external ear and ear canal normal.  Left Ear: Hearing, tympanic membrane, external ear and ear canal normal.  Nose: Mucosal edema and rhinorrhea present.  No foreign bodies. Right sinus exhibits no maxillary sinus tenderness and no frontal sinus tenderness. Left sinus exhibits no maxillary sinus tenderness and no frontal sinus tenderness.  Mouth/Throat: Uvula is midline, oropharynx is clear and moist and mucous membranes are  normal. No uvula swelling. No oropharyngeal exudate.  Eyes: Conjunctivae and EOM are normal. Pupils are equal, round, and reactive to light. Right eye exhibits no discharge. Left eye exhibits no discharge. No scleral icterus.  Neck: Trachea normal, normal range of motion and full passive range of motion without pain. Neck supple. No thyroid mass and no thyromegaly present.  Cardiovascular: Normal rate, regular rhythm and normal heart sounds.   Pulmonary/Chest: Effort normal and breath sounds normal.  Abdominal: Bowel sounds are normal. There is no tenderness.  Lymphadenopathy:       Head (right side): No submandibular, no tonsillar, no preauricular, no posterior auricular and no occipital adenopathy present.       Head (left side): No submandibular, no tonsillar, no preauricular and no occipital adenopathy present.    He has no cervical adenopathy.       Right: No supraclavicular adenopathy present.       Left: No supraclavicular adenopathy present.  Neurological: He is alert and oriented to person, place, and time. He has normal strength. No cranial nerve deficit or sensory deficit.  Skin: Skin is warm, dry and intact. No rash noted.  Psychiatric: He has a normal mood and affect. His speech is normal and behavior is normal.      Assessment & Plan:   1. Acute viral syndrome Presumptive influenza.  Treat as such. OOW. RTC if worsens, or if unable to RTW 10/03/2016. - benzonatate (TESSALON) 100 MG capsule; Take 1-2 capsules (100-200 mg total) by mouth 3 (three) times daily as needed for cough.  Dispense: 40 capsule; Refill: 0 - Azelastine HCl 0.15 % SOLN; Place 2 sprays into both nostrils 2 (two) times daily.  Dispense: 30 mL; Refill: 0 - Guaifenesin (MUCINEX MAXIMUM STRENGTH) 1200 MG TB12; Take 1 tablet (1,200 mg total) by mouth every 12 (twelve) hours as needed.  Dispense: 14 tablet; Refill: 1 - oseltamivir (TAMIFLU) 75 MG capsule; Take 1 capsule (75 mg total) by mouth 2 (two) times daily.   Dispense: 10 capsule; Refill: 0   Fara Chute, PA-C Physician Assistant-Certified [facility]

## 2016-10-30 ENCOUNTER — Ambulatory Visit (HOSPITAL_COMMUNITY)
Admission: EM | Admit: 2016-10-30 | Discharge: 2016-10-30 | Disposition: A | Discharge status: Other Acute Inpatient Hospital | Payer: BC Managed Care – PPO

## 2016-10-30 ENCOUNTER — Ambulatory Visit (INDEPENDENT_AMBULATORY_CARE_PROVIDER_SITE_OTHER): Payer: BC Managed Care – PPO | Admitting: Emergency Medicine

## 2016-10-30 ENCOUNTER — Ambulatory Visit (INDEPENDENT_AMBULATORY_CARE_PROVIDER_SITE_OTHER): Payer: BC Managed Care – PPO

## 2016-10-30 ENCOUNTER — Encounter: Payer: Self-pay | Admitting: Emergency Medicine

## 2016-10-30 VITALS — BP 148/91 | HR 71 | Temp 98.5°F | Resp 17 | Ht 71.0 in | Wt 176.0 lb

## 2016-10-30 DIAGNOSIS — M5442 Lumbago with sciatica, left side: Secondary | ICD-10-CM

## 2016-10-30 MED ORDER — CYCLOBENZAPRINE HCL 10 MG PO TABS: 10.0000 mg | ORAL_TABLET | Freq: Three times a day (TID) | ORAL | 0 refills | Status: AC | PRN

## 2016-10-30 MED ORDER — DICLOFENAC SODIUM 75 MG PO TBEC: 75.0000 mg | DELAYED_RELEASE_TABLET | Freq: Two times a day (BID) | ORAL | 0 refills | Status: AC

## 2016-10-30 NOTE — Progress Notes (Signed)
Dillon Reeves 56 y.o.   Chief Complaint  Patient presents with  . Back Pain    radiating down left leg onset 3-4 weeks down into foot  . Extremity Weakness    hip and left leg    HISTORY OF PRESENT ILLNESS: This is a 56 y.o. male complaining of several weeks h/o left lumbar pain radiating down back of left leg with numbness and tingling.  Back Pain  This is a new problem. The current episode started 1 to 4 weeks ago. The problem occurs constantly. The problem has been waxing and waning since onset. The pain is present in the lumbar spine. The quality of the pain is described as aching. The pain radiates to the left knee, left thigh and left foot. The pain is at a severity of 6/10. The pain is moderate. The symptoms are aggravated by twisting and position. Associated symptoms include leg pain, tingling and weakness. Pertinent negatives include no abdominal pain, bladder incontinence, bowel incontinence, chest pain, dysuria, fever, headaches, paresthesias, pelvic pain, perianal numbness or weight loss. Risk factors: TB exposure ([facility]?) He has tried nothing for the symptoms.     Prior to Admission medications   Medication Sig Start Date End Date Taking? Authorizing Provider  rifampin (RIFADIN) 300 MG capsule Take by mouth 2 (two) times daily.   Yes Historical Provider, MD    No Known Allergies  Patient Active Problem List   Diagnosis Date Noted  . 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 Allergy   . Anxiety   . Metacarpal bone fracture    5th finger    Past Surgical History:  Procedure Laterality Date  . OPEN REDUCTION INTERNAL FIXATION (ORIF) HAND Right 01/29/2015   Procedure: OPEN TREATMENT RIGHT FIFTH METACARPAL FRACTURE;  Surgeon: Milly Jakob, MD;  Location: [facility];  Service: Orthopedics;  Laterality: Right;    Social History   Social History  . Marital status: Married    Spouse name: separated  . Number of  children: 3  . Years of education: 2+ years college   Occupational History  . 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 bus driver [employer] History Main Topics  . Smoking status: Never Smoker  . Smokeless tobacco: Never Used  . Alcohol use Yes     Comment: once or twice every two weeks  . Drug use: No  . Sexual activity: Yes   Other Topics Concern  . Not on file   Social History Narrative   Lives with his wife and their children.   Separating from wife (08/2016).    Family History  Problem Relation Age of Onset  . Heart Problems Mother   . Anemia Mother   . Hypertension Brother      Review of Systems  Constitutional: Negative for chills, fever and weight loss.  HENT: Negative.   Eyes: Negative.   Respiratory: Negative.  Negative for cough and shortness of breath.   Cardiovascular: Negative.  Negative for chest pain, claudication and leg swelling.  Gastrointestinal: Negative for abdominal pain, bowel incontinence, diarrhea, nausea and vomiting.  Genitourinary: Negative for bladder incontinence, dysuria, hematuria and pelvic pain.  Musculoskeletal: Positive for back pain.  Skin: Negative for rash.  Neurological: Positive for tingling, focal weakness (left leg slightly weaker) and weakness. Negative for dizziness, headaches and paresthesias.  Endo/Heme/Allergies: Negative.   All other systems reviewed and are negative.   Vitals:   10/30/16  1725  BP: (!) 148/91  Pulse: 71  Resp: 17  Temp: 98.5 F (36.9 C)    Physical Exam  Constitutional: He is oriented to person, place, and time. He appears well-developed and well-nourished.  HENT:  Head: Normocephalic and atraumatic.  Mouth/Throat: Oropharynx is clear and moist.  Eyes: Conjunctivae and EOM are normal. Pupils are equal, round, and reactive to light.  Neck: Normal range of motion. Neck supple. No JVD present. No thyromegaly present.  Cardiovascular: Normal rate, regular rhythm, normal heart  sounds and intact distal pulses.   Pulmonary/Chest: Effort normal and breath sounds normal.  Abdominal: Soft. Bowel sounds are normal. He exhibits no distension. There is no tenderness.  Musculoskeletal: Normal range of motion.  Lymphadenopathy:    He has no cervical adenopathy.  Neurological: He is alert and oriented to person, place, and time. He displays normal reflexes. No cranial nerve deficit or sensory deficit. He exhibits normal muscle tone. Coordination normal.  Skin: Skin is warm and dry. Capillary refill takes less than 2 seconds. No rash noted.  Psychiatric: He has a normal mood and affect. His behavior is normal.  Vitals reviewed.    ASSESSMENT & PLAN: Rhyatt was seen today for back pain and extremity weakness.  Diagnoses and all orders for this visit:  Acute left-sided low back pain with left-sided sciatica -     DG Lumbar Spine 2-3 Views; Future -     Ambulatory referral to Orthopedic Surgery  Other orders -     diclofenac (VOLTAREN) 75 MG EC tablet; Take 1 tablet (75 mg total) by mouth 2 (two) times daily. -     cyclobenzaprine (FLEXERIL) 10 MG tablet; Take 1 tablet (10 mg total) by mouth 3 (three) times daily as needed for muscle spasms.    Patient Instructions       IF you received an x-ray today, you will receive an invoice from [facility]. Please contact [facility] at [phone] with questions or concerns regarding your invoice.   IF you received labwork today, you will receive an invoice from [facility]. Please contact [facility] at [phone] with questions or concerns regarding your invoice.   Our billing staff will not be able to assist you with questions regarding bills from these companies.  You will be contacted with the lab results as soon as they are available. The fastest way to get your results is to activate your My Chart account. Instructions are located on the last page of this paperwork. If you have not heard from [place]  regarding the results in 2 weeks, please contact this office.     Sciatica Sciatica is pain, numbness, weakness, or tingling along your sciatic nerve. The sciatic nerve starts in the lower back and goes down the back of each leg. Sciatica happens when this nerve is pinched or has pressure put on it. Sciatica usually goes away on its own or with treatment. Sometimes, sciatica may keep coming back (recur). Follow these instructions at home: Medicines   Take over-the-counter and prescription medicines only as told by your doctor.  Do not drive or use heavy machinery while taking prescription pain medicine. Managing pain   If directed, put ice on the affected area.  Put ice in a plastic bag.  Place a towel between your skin and the bag.  Leave the ice on for 20 minutes, 2-3 times a day.  After icing, apply heat to the affected area before you exercise or as often as told by your doctor. Use  the heat source that your doctor tells you to use, such as a moist heat pack or a heating pad.  Place a towel between your skin and the heat source.  Leave the heat on for 20-30 minutes.  Remove the heat if your skin turns bright red. This is especially important if you are unable to feel pain, heat, or cold. You may have a greater risk of getting burned. Activity   Return to your normal activities as told by your doctor. Ask your doctor what activities are safe for you.  Avoid activities that make your sciatica worse.  Take short rests during the day. Rest in a lying or standing position. This is usually better than sitting to rest.  When you rest for a long time, do some physical activity or stretching between periods of rest.  Avoid sitting for a long time without moving. Get up and move around at least one time each hour.  Exercise and stretch regularly, as told by your doctor.  Do not lift anything that is heavier than 10 lb (4.5 kg) while you have symptoms of sciatica.  Avoid lifting  heavy things even when you do not have symptoms.  Avoid lifting heavy things over and over.  When you lift objects, always lift in a way that is safe for your body. To do this, you should:  Bend your knees.  Keep the object close to your body.  Avoid twisting. General instructions   Use good posture.  Avoid leaning forward when you are sitting.  Avoid hunching over when you are standing.  Stay at a healthy weight.  Wear comfortable shoes that support your feet. Avoid wearing high heels.  Avoid sleeping on a mattress that is too soft or too hard. You might have less pain if you sleep on a mattress that is firm enough to support your back.  Keep all follow-up visits as told by your doctor. This is important. Contact a doctor if:  You have pain that:  Wakes you up when you are sleeping.  Gets worse when you lie down.  Is worse than the pain you have had in the past.  Lasts longer than 4 weeks.  You lose weight for without trying. Get help right away if:  You cannot control when you pee (urinate) or poop (have a bowel movement).  You have weakness in any of these areas and it gets worse.  Lower back.  Lower belly (pelvis).  Butt (buttocks).  Legs.  You have redness or swelling of your back.  You have a burning feeling when you pee. This information is not intended to replace advice given to you by your health care provider. Make sure you discuss any questions you have with your health care provider. Document Released: 03/28/2008 Document Revised: 11/25/2015 Document Reviewed: 02/26/2015 Elsevier Interactive Patient Education  2017 Elsevier Inc.      Agustina Caroli, MD [facility]

## 2016-10-30 NOTE — Patient Instructions (Addendum)
IF you received an x-ray today, you will receive an invoice from [facility]. Please contact [facility] at [phone] with questions or concerns regarding your invoice.   IF you received labwork today, you will receive an invoice from [facility]. Please contact [facility] at [phone] with questions or concerns regarding your invoice.   Our billing staff will not be able to assist you with questions regarding bills from these companies.  You will be contacted with the lab results as soon as they are available. The fastest way to get your results is to activate your My Chart account. Instructions are located on the last page of this paperwork. If you have not heard from [place] regarding the results in 2 weeks, please contact this office.     Sciatica Sciatica is pain, numbness, weakness, or tingling along your sciatic nerve. The sciatic nerve starts in the lower back and goes down the back of each leg. Sciatica happens when this nerve is pinched or has pressure put on it. Sciatica usually goes away on its own or with treatment. Sometimes, sciatica may keep coming back (recur). Follow these instructions at home: Medicines   Take over-the-counter and prescription medicines only as told by your doctor.  Do not drive or use heavy machinery while taking prescription pain medicine. Managing pain   If directed, put ice on the affected area.  Put ice in a plastic bag.  Place a towel between your skin and the bag.  Leave the ice on for 20 minutes, 2-3 times a day.  After icing, apply heat to the affected area before you exercise or as often as told by your doctor. Use the heat source that your doctor tells you to use, such as a moist heat pack or a heating pad.  Place a towel between your skin and the heat source.  Leave the heat on for 20-30 minutes.  Remove the heat if your skin turns bright red. This is especially important if you are unable to feel pain, heat, or  cold. You may have a greater risk of getting burned. Activity   Return to your normal activities as told by your doctor. Ask your doctor what activities are safe for you.  Avoid activities that make your sciatica worse.  Take short rests during the day. Rest in a lying or standing position. This is usually better than sitting to rest.  When you rest for a long time, do some physical activity or stretching between periods of rest.  Avoid sitting for a long time without moving. Get up and move around at least one time each hour.  Exercise and stretch regularly, as told by your doctor.  Do not lift anything that is heavier than 10 lb (4.5 kg) while you have symptoms of sciatica.  Avoid lifting heavy things even when you do not have symptoms.  Avoid lifting heavy things over and over.  When you lift objects, always lift in a way that is safe for your body. To do this, you should:  Bend your knees.  Keep the object close to your body.  Avoid twisting. General instructions   Use good posture.  Avoid leaning forward when you are sitting.  Avoid hunching over when you are standing.  Stay at a healthy weight.  Wear comfortable shoes that support your feet. Avoid wearing high heels.  Avoid sleeping on a mattress that is too soft or too hard. You might have less pain if you sleep on a mattress  that is firm enough to support your back.  Keep all follow-up visits as told by your doctor. This is important. Contact a doctor if:  You have pain that:  Wakes you up when you are sleeping.  Gets worse when you lie down.  Is worse than the pain you have had in the past.  Lasts longer than 4 weeks.  You lose weight for without trying. Get help right away if:  You cannot control when you pee (urinate) or poop (have a bowel movement).  You have weakness in any of these areas and it gets worse.  Lower back.  Lower belly (pelvis).  Butt (buttocks).  Legs.  You have redness  or swelling of your back.  You have a burning feeling when you pee. This information is not intended to replace advice given to you by your health care provider. Make sure you discuss any questions you have with your health care provider. Document Released: 03/28/2008 Document Revised: 11/25/2015 Document Reviewed: 02/26/2015 Elsevier Interactive Patient Education  2017 [employer].

## 2016-11-09 ENCOUNTER — Encounter (INDEPENDENT_AMBULATORY_CARE_PROVIDER_SITE_OTHER): Payer: Self-pay | Admitting: Orthopaedic Surgery

## 2016-11-09 ENCOUNTER — Ambulatory Visit (INDEPENDENT_AMBULATORY_CARE_PROVIDER_SITE_OTHER): Payer: BC Managed Care – PPO | Admitting: Orthopaedic Surgery

## 2016-11-09 DIAGNOSIS — M5442 Lumbago with sciatica, left side: Secondary | ICD-10-CM

## 2016-11-09 MED ORDER — PREDNISONE 10 MG (21) PO TBPK: ORAL_TABLET | ORAL | 0 refills | Status: DC

## 2016-11-09 MED ORDER — CELECOXIB 200 MG PO CAPS: 200.0000 mg | ORAL_CAPSULE | Freq: Two times a day (BID) | ORAL | 3 refills | Status: DC

## 2016-11-09 MED ORDER — METHOCARBAMOL 500 MG PO TABS: 500.0000 mg | ORAL_TABLET | Freq: Four times a day (QID) | ORAL | 2 refills | Status: DC | PRN

## 2016-11-09 NOTE — Progress Notes (Signed)
Office Visit Note   Patient: Dillon Reeves           Date of Birth: [birth year]           MRN: [id number] Visit Date: 11/09/2016              Requested by: Horald Pollen, MD [place], [place] [address] PCP: Patient, No Pcp Per   Assessment & Plan: Visit Diagnoses:  1. Acute left-sided low back pain with left-sided sciatica     Plan: Impression is continued left-sided sciatica. I gave him prescription for prednisone Robaxin and Celebrex. Hopefully this will take care of it. If not better. He is to follow up and we'll consider MRI.  Follow-Up Instructions: Return if symptoms worsen or fail to improve.   Orders:  No orders of the defined types were placed in this encounter.  Meds ordered this encounter  Medications  . predniSONE (STERAPRED UNI-PAK 21 TAB) 10 MG (21) TBPK tablet    Sig: Take as directed    Dispense:  21 tablet    Refill:  0  . methocarbamol (ROBAXIN) 500 MG tablet    Sig: Take 1 tablet (500 mg total) by mouth every 6 (six) hours as needed for muscle spasms.    Dispense:  30 tablet    Refill:  2  . celecoxib (CELEBREX) 200 MG capsule    Sig: Take 1 capsule (200 mg total) by mouth 2 (two) times daily.    Dispense:  30 capsule    Refill:  3      Procedures: No procedures performed   Clinical Data: No additional findings.   Subjective: Chief Complaint  Patient presents with  . Lower Back - Pain    Patient is a 56 year old gentleman who is had left-sided sciatica for the last month. He saw his PCP and was placed on Flexeril and diclofenac. He can continues to complain of sharp pain sometimes with groin pain. 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 He's been doing some stretches. Endorses numbness and tingling in his legs. Denies any constitutional symptoms.    Review of Systems  Constitutional: Negative.   All other systems reviewed and are negative.    Objective: Vital Signs: There were no vitals taken for this  visit.  Physical Exam  Constitutional: He is oriented to person, place, and time. He appears well-developed and well-nourished.  HENT:  Head: Normocephalic and atraumatic.  Eyes: Pupils are equal, round, and reactive to light.  Neck: Neck supple.  Pulmonary/Chest: Effort normal.  Abdominal: Soft.  Musculoskeletal: Normal range of motion.  Neurological: He is alert and oriented to person, place, and time.  Skin: Skin is warm.  Psychiatric: He has a normal mood and affect. His behavior is normal. Judgment and thought content normal.  Nursing note and vitals reviewed.   Ortho Exam Left lower extremity and back exam is consistent with sciatica. Positive sciatic tension signs. No focal deficits. Painless full range of motion of the hip. Lateral hip is nontender. Specialty Comments:  No specialty comments available.  Imaging: No results found.   PMFS History: Patient Active Problem List   Diagnosis Date Noted  . Acute left-sided low back pain with left-sided sciatica 10/30/2016  . 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 Allergy   . Anxiety   . Metacarpal bone fracture    5th finger    Family History  Problem Relation Age of  Onset  . Heart Problems Mother   . Anemia Mother   . Hypertension Brother     Past Surgical History:  Procedure Laterality Date  . OPEN REDUCTION INTERNAL FIXATION (ORIF) HAND Right 01/29/2015   Procedure: OPEN TREATMENT RIGHT FIFTH METACARPAL FRACTURE;  Surgeon: Milly Jakob, MD;  Location: [facility];  Service: Orthopedics;  Laterality: Right;   Social History   Occupational History  . 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 bus driver [place] History Main Topics  . Smoking status: Never Smoker  . Smokeless tobacco: Never Used  . Alcohol use Yes     Comment: once or twice every two weeks  . Drug use: No  . Sexual activity: Yes

## 2016-12-05 ENCOUNTER — Telehealth (INDEPENDENT_AMBULATORY_CARE_PROVIDER_SITE_OTHER): Payer: Self-pay | Admitting: Radiology

## 2016-12-05 ENCOUNTER — Other Ambulatory Visit (INDEPENDENT_AMBULATORY_CARE_PROVIDER_SITE_OTHER): Payer: Self-pay

## 2016-12-05 DIAGNOSIS — G8929 Other chronic pain: Secondary | ICD-10-CM

## 2016-12-05 DIAGNOSIS — M545 Low back pain: Principal | ICD-10-CM

## 2016-12-05 MED ORDER — TRAMADOL HCL 50 MG PO TABS: 50.0000 mg | ORAL_TABLET | Freq: Three times a day (TID) | ORAL | 0 refills | Status: DC

## 2016-12-05 NOTE — Telephone Encounter (Signed)
Kathlee Nations placed order for MRI for patient. Advised patient over the phone that we would start working on insurance approval for this. Advised ok per Dr. Erlinda Hong for tramadol is ok to send into pharmacy. I called this in for the patient [facility] on [address].

## 2016-12-05 NOTE — Addendum Note (Signed)
Addended by: Maxcine Ham on: 12/05/2016 09:43 AM   Modules accepted: Orders

## 2016-12-05 NOTE — Telephone Encounter (Signed)
Patient calling triage today, wanting to get injection in his back. Advised he would need a MRI first per Dr. Phoebe Sharps office note. He states none of the previous medications helped. He states his leg is in severe pain and is wanting something for pain to get him through until MRI is scheduled. He is using [facility] on [address]. He would like something within the next 1-2 hours, because he is having to go back to work. He states he needs something he can work with, advised narcotics caused drowsiness and he would be unable to drive with this. He states nothing OTC is helping either. Please advise [phone].

## 2016-12-09 ENCOUNTER — Ambulatory Visit
Admission: RE | Admit: 2016-12-09 | Discharge: 2016-12-09 | Disposition: A | Discharge status: Home / Self Care | Payer: BC Managed Care – PPO | Source: Ambulatory Visit | Attending: Orthopaedic Surgery | Admitting: Orthopaedic Surgery

## 2016-12-09 DIAGNOSIS — G8929 Other chronic pain: Secondary | ICD-10-CM

## 2016-12-09 DIAGNOSIS — M545 Low back pain: Principal | ICD-10-CM

## 2016-12-14 ENCOUNTER — Encounter (INDEPENDENT_AMBULATORY_CARE_PROVIDER_SITE_OTHER): Payer: Self-pay

## 2016-12-14 ENCOUNTER — Other Ambulatory Visit (INDEPENDENT_AMBULATORY_CARE_PROVIDER_SITE_OTHER): Payer: Self-pay

## 2016-12-14 ENCOUNTER — Encounter (INDEPENDENT_AMBULATORY_CARE_PROVIDER_SITE_OTHER): Payer: Self-pay | Admitting: Orthopaedic Surgery

## 2016-12-14 ENCOUNTER — Ambulatory Visit (INDEPENDENT_AMBULATORY_CARE_PROVIDER_SITE_OTHER): Payer: BC Managed Care – PPO | Admitting: Orthopaedic Surgery

## 2016-12-14 DIAGNOSIS — M5442 Lumbago with sciatica, left side: Secondary | ICD-10-CM | POA: Diagnosis not present

## 2016-12-18 ENCOUNTER — Ambulatory Visit (INDEPENDENT_AMBULATORY_CARE_PROVIDER_SITE_OTHER): Payer: BC Managed Care – PPO | Admitting: Physical Medicine and Rehabilitation

## 2016-12-18 ENCOUNTER — Encounter (INDEPENDENT_AMBULATORY_CARE_PROVIDER_SITE_OTHER): Payer: Self-pay | Admitting: Physical Medicine and Rehabilitation

## 2016-12-18 ENCOUNTER — Ambulatory Visit (INDEPENDENT_AMBULATORY_CARE_PROVIDER_SITE_OTHER): Payer: BC Managed Care – PPO

## 2016-12-18 VITALS — BP 134/84 | HR 71

## 2016-12-18 DIAGNOSIS — M5416 Radiculopathy, lumbar region: Secondary | ICD-10-CM

## 2016-12-18 DIAGNOSIS — M5116 Intervertebral disc disorders with radiculopathy, lumbar region: Secondary | ICD-10-CM

## 2016-12-18 MED ORDER — LIDOCAINE HCL (PF) 1 % IJ SOLN
2.0000 mL | Freq: Once | INTRAMUSCULAR | Status: AC
  Administered 2016-12-18: 2 mL

## 2016-12-18 MED ORDER — METHYLPREDNISOLONE ACETATE 80 MG/ML IJ SUSP
80.0000 mg | Freq: Once | INTRAMUSCULAR | Status: AC
  Administered 2016-12-18: 80 mg

## 2016-12-18 NOTE — Progress Notes (Deleted)
Left side low back pain into buttock and down back of the leg to foot for around 2 to 3 months. Constant. Worse with sitting and standing and then goes to get up to move. Cramping down back of leg at times. Occasionally numbness and tingling in leg and sometimes to foot.

## 2016-12-18 NOTE — Patient Instructions (Signed)

## 2016-12-19 ENCOUNTER — Encounter (INDEPENDENT_AMBULATORY_CARE_PROVIDER_SITE_OTHER): Payer: Self-pay | Admitting: Physical Medicine and Rehabilitation

## 2016-12-19 NOTE — Procedures (Signed)
S1 Lumbosacral Transforaminal Epidural Steroid Injection - Sub-Pedicular Approach with Fluoroscopic Guidance   Patient: Dillon Reeves      Date of Birth: October 15, 1960 MRN: [id number] PCP: Patient, No Pcp Per      Visit Date: 12/18/2016   [name] is a 56 year old gentleman with several months of worsening left buttock and hamstring pain with some paresthesia in the leg. He had failed conservative care started seeing Dr. Wyline Copas in our office back in May. 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 The disc herniation is brought enough to approximate the right S1 as well but he is having all left-sided symptoms. His symptoms are consistent with disc herniation. He has worsening with sitting. He has not had much relief with pain medications. He also reports cramping down the back of the leg. He gets some numbness and tingling to the side of the foot. We are going to complete a diagnostic and hopefully therapeutic left S1 transforaminal epidural steroid injection. He is going to notify us in 10 days or so how he is doing we may look a repeat. Exam shows no strength deficits but positive slump test on the left.  Universal Protocol:    Date/Time: 06/19/185:54 AM  Consent Given By: the patient  Position:  PRONE  Additional Comments: Vital signs were monitored before and after the procedure. Patient was prepped and draped in the usual sterile fashion. The correct patient, procedure, and site was verified.   Injection Procedure Details:  Procedure Site One Meds Administered:  Meds ordered this encounter  Medications  . lidocaine (PF) (XYLOCAINE) 1 % injection 2 mL  . methylPREDNISolone acetate (DEPO-MEDROL) injection 80 mg    Laterality: Left  Location/Site:  S1 Foramen   Needle size: 21 G  Needle type: Spinal  Needle Placement: Transforaminal  Findings:  -Contrast Used: 1 mL  iohexol 180 mg iodine/mL   -Comments: Excellent flow of contrast along the nerve and into the epidural space.  Procedure Details: After squaring off the sacral end-plate to get a true AP view, the C-arm was positioned so that the best possible view of the S1 foramen was visualized. The soft tissues overlying this structure were infiltrated with 2-3 ml. of 1% Lidocaine without Epinephrine.    The spinal needle was inserted toward the target using a "trajectory" view along the fluoroscope beam.  Under AP and lateral visualization, the needle was advanced so it did not puncture dura. Biplanar projections were used to confirm position. Aspiration was confirmed to be negative for CSF and/or blood. A 1-2 ml. volume of Isovue-250 was injected and flow of contrast was noted at each level. Radiographs were obtained for documentation purposes.   After attaining the desired flow of contrast documented above, a 0.5 to 1.0 ml test dose of 0.25% Marcaine was injected into each respective transforaminal space.  The patient was observed for 90 seconds post injection.  After no sensory deficits were reported, and normal lower extremity motor function was noted,   the above injectate was administered so that equal amounts of the injectate were placed at each foramen (level) into the transforaminal epidural space.   Additional Comments:  The patient tolerated the procedure well No complications occurred Dressing: Band-Aid    Post-procedure details: Patient was observed during the procedure. Post-procedure instructions were reviewed.  Patient left the clinic in stable condition.

## 2017-02-10 NOTE — Progress Notes (Signed)
Cancelled appt

## 2018-02-25 ENCOUNTER — Ambulatory Visit: Payer: BC Managed Care – PPO | Admitting: Family Medicine

## 2018-02-25 ENCOUNTER — Other Ambulatory Visit: Payer: Self-pay

## 2018-02-25 ENCOUNTER — Telehealth: Payer: Self-pay | Admitting: Family Medicine

## 2018-02-25 ENCOUNTER — Encounter: Payer: Self-pay | Admitting: Family Medicine

## 2018-02-25 VITALS — BP 142/100 | HR 71 | Temp 98.7°F | Ht 71.0 in | Wt 179.8 lb

## 2018-02-25 DIAGNOSIS — M5442 Lumbago with sciatica, left side: Secondary | ICD-10-CM

## 2018-02-25 DIAGNOSIS — M5127 Other intervertebral disc displacement, lumbosacral region: Secondary | ICD-10-CM | POA: Diagnosis not present

## 2018-02-25 MED ORDER — CYCLOBENZAPRINE HCL 5 MG PO TABS: 5.0000 mg | ORAL_TABLET | Freq: Three times a day (TID) | ORAL | 1 refills | Status: DC | PRN

## 2018-02-25 MED ORDER — HYDROCODONE-ACETAMINOPHEN 5-325 MG PO TABS: 0.5000 | ORAL_TABLET | Freq: Four times a day (QID) | ORAL | 0 refills | Status: DC | PRN

## 2018-02-25 MED ORDER — PREDNISONE 20 MG PO TABS: ORAL_TABLET | ORAL | 0 refills | Status: DC

## 2018-02-25 NOTE — Patient Instructions (Addendum)
Current symptoms appear to be due to a flare of sciatica from the pinched nerve, disc herniation.  We can try prednisone one more time along with the muscle relaxant Flexeril up to every 8 hours.  Start that at bedtime due to sedation and do not drive or operate machinery while you are taking that medication.  If you need additional medication to help with pain, I did write a short course of hydrocodone, but that also can cause sedation and dizziness so be careful combining that with the muscle relaxant.  I will refer you to the back specialist, but if you are having worsening symptoms prior to that visit, return here or the emergency room.  Return to the clinic or go to the nearest emergency room if any of your symptoms worsen or new symptoms occur.  Keep a record of your blood pressures outside of the office andif over 140/90 as pain is controlled better, return to discuss meds. We can recheck that as well in next 1-2 weeks.     Sciatica Sciatica is pain, numbness, weakness, or tingling along the path of the sciatic nerve. The sciatic nerve starts in the lower back and runs down the back of each leg. The nerve controls the muscles in the lower leg and in the back of the knee. It also provides feeling (sensation) to the back of the thigh, the lower leg, and the sole of the foot. Sciatica is a symptom of another medical condition that pinches or puts pressure on the sciatic nerve. Generally, sciatica only affects one side of the body. Sciatica usually goes away on its own or with treatment. In some cases, sciatica may keep coming back (recur). What are the causes? This condition is caused by pressure on the sciatic nerve, or pinching of the sciatic nerve. This may be the result of:  A disk in between the bones of the spine (vertebrae) bulging out too far (herniated disk).  Age-related changes in the spinal disks (degenerative disk disease).  A pain disorder that affects a muscle in the buttock  (piriformis syndrome).  Extra bone growth (bone spur) near the sciatic nerve.  An injury or break (fracture) of the pelvis.  Pregnancy.  Tumor (rare).  What increases the risk? The following factors may make you more likely to develop this condition:  Playing sports that place pressure or stress on the spine, such as football or weight lifting.  Having poor strength and flexibility.  A history of back injury.  A history of back surgery.  Sitting for long periods of time.  Doing activities that involve repetitive bending or lifting.  Obesity.  What are the signs or symptoms? Symptoms can vary from mild to very severe, and they may include:  Any of these problems in the lower back, leg, hip, or buttock: ? Mild tingling or dull aches. ? Burning sensations. ? Sharp pains.  Numbness in the back of the calf or the sole of the foot.  Leg weakness.  Severe back pain that makes movement difficult.  These symptoms may get worse when you cough, sneeze, or laugh, or when you sit or stand for long periods of time. Being overweight may also make symptoms worse. In some cases, symptoms may recur over time. How is this diagnosed? This condition may be diagnosed based on:  Your symptoms.  A physical exam. Your health care provider may ask you to do certain movements to check whether those movements trigger your symptoms.  You may have tests,  including: ? Blood tests. ? X-rays. ? MRI. ? CT scan.  How is this treated? In many cases, this condition improves on its own, without any treatment. However, treatment may include:  Reducing or modifying physical activity during periods of pain.  Exercising and stretching to strengthen your abdomen and improve the flexibility of your spine.  Icing and applying heat to the affected area.  Medicines that help: ? To relieve pain and swelling. ? To relax your muscles.  Injections of medicines that help to relieve pain, irritation,  and inflammation around the sciatic nerve (steroids).  Surgery.  Follow these instructions at home: Medicines  Take over-the-counter and prescription medicines only as told by your health care provider.  Do not drive or operate heavy machinery while taking prescription pain medicine. Managing pain  If directed, apply ice to the affected area. ? Put ice in a plastic bag. ? Place a towel between your skin and the bag. ? Leave the ice on for 20 minutes, 2-3 times a day.  After icing, apply heat to the affected area before you exercise or as often as told by your health care provider. Use the heat source that your health care provider recommends, such as a moist heat pack or a heating pad. ? Place a towel between your skin and the heat source. ? Leave the heat on for 20-30 minutes. ? Remove the heat if your skin turns bright red. This is especially important if you are unable to feel pain, heat, or cold. You may have a greater risk of getting burned. Activity  Return to your normal activities as told by your health care provider. Ask your health care provider what activities are safe for you. ? Avoid activities that make your symptoms worse.  Take brief periods of rest throughout the day. Resting in a lying or standing position is usually better than sitting to rest. ? When you rest for longer periods, mix in some mild activity or stretching between periods of rest. This will help to prevent stiffness and pain. ? Avoid sitting for long periods of time without moving. Get up and move around at least one time each hour.  Exercise and stretch regularly, as told by your health care provider.  Do not lift anything that is heavier than 10 lb (4.5 kg) while you have symptoms of sciatica. When you do not have symptoms, you should still avoid heavy lifting, especially repetitive heavy lifting.  When you lift objects, always use proper lifting technique, which includes: ? Bending your  knees. ? Keeping the load close to your body. ? Avoiding twisting. General instructions  Use good posture. ? Avoid leaning forward while sitting. ? Avoid hunching over while standing.  Maintain a healthy weight. Excess weight puts extra stress on your back and makes it difficult to maintain good posture.  Wear supportive, comfortable shoes. Avoid wearing high heels.  Avoid sleeping on a mattress that is too soft or too hard. A mattress that is firm enough to support your back when you sleep may help to reduce your pain.  Keep all follow-up visits as told by your health care provider. This is important. Contact a health care provider if:  You have pain that wakes you up when you are sleeping.  You have pain that gets worse when you lie down.  Your pain is worse than you have experienced in the past.  Your pain lasts longer than 4 weeks.  You experience unexplained weight loss. Get help right  away if:  You lose control of your bowel or bladder (incontinence).  You have: ? Weakness in your lower back, pelvis, buttocks, or legs that gets worse. ? Redness or swelling of your back. ? A burning sensation when you urinate. This information is not intended to replace advice given to you by your health care provider. Make sure you discuss any questions you have with your health care provider. Document Released: 06/13/2001 Document Revised: 11/23/2015 Document Reviewed: 02/26/2015 Elsevier Interactive Patient Education  2018 [employer].  Herniated Disk A herniated disk, also called a ruptured disk or slipped disk, occurs when a disk in the spine bulges out too far. Between the bones in the spine (vertebrae), there are oval disks that are made of a soft, spongy center that is surrounded by a tough outer ring. The disks connect your vertebrae, help your spine move, and absorb shocks from your movement. When you have a herniated disk, the spongy center of the disk bulges out or breaks  through the outer ring. It can press on a nerve between the vertebrae and cause pain. This can occur anywhere in the back or neck area, but the lower back is most commonly affected. What are the causes? This condition may be caused by:  Age-related wear and tear. The spongy centers of spinal disks tend to shrink and dry out with age, which makes them more likely to herniate.  Sudden injury, such as a strain or sprain.  What increases the risk? Aging is the main risk factor for a herniated disk. Other risk factors include:  Being a man who is 51-62 years old.  Frequently doing activities that involve heavy lifting, bending, or twisting.  Frequently driving for long hours at a time.  Not getting enough exercise.  Being overweight.  Smoking.  Having a family history of back problems or herniated disks.  Being pregnant or giving birth.  Having poor nutrition.  Being tall.  What are the signs or symptoms? Symptoms may vary depending on where your herniated disk is located.  A herniated disk in the lower back may cause sharp pain in: ? Part of the arm, leg, hip, or buttocks. ? The back of the lower leg (calf). ? The lower back, spreading down through the leg into the foot (sciatica).  A herniated disk in the neck may cause dizziness and vertigo. It may also cause pain or weakness in: ? The neck. ? The shoulder blades. ? Upper arm, forearm, or fingers.  You may also have muscle weakness. It may be difficult to: ? Lift your leg or arm. ? Stand on your toes. ? Squeeze tightly with one of your hands.  Other symptoms may include: ? Numbness or tingling in the affected areas of the hands, arms, feet, or legs. ? Inability to control when you urinate or when you have bowel movements. This is a rare but serious sign of a severe herniated disk in the lower back.  How is this diagnosed? This condition may be diagnosed based on:  Your symptoms.  Your medical history.  A  physical exam. The exam may include: ? Straight-leg test. You will lie on your back while your health care provider lifts your leg, keeping your knee straight. If you feel pain, you likely have a herniated disk. ? Neurological tests. This includes checking for numbness, reflexes, muscle strength, and posture.  Imaging tests, such as: ? X-rays. ? MRI. ? CT scan. ? Electromyogram (EMG) to check the nerves that control  muscles. This test may be used to determine which nerves are affected by your herniated disk.  How is this treated? Treatment for this condition may include:  A short period of rest. This is usually the first treatment. ? You may be on bed rest for up to 2 days, or you may be instructed to stay home and avoid physical activity. ? If you have a herniated disk in your lower back, avoid sitting as much as possible. Sitting increases pressure on the disk.  Medicines. These may include: ? NSAIDs to help reduce pain and swelling. ? Muscle relaxants to prevent sudden tightening of the back muscles (back spasms). ? Prescription pain medicines, if you have severe pain.  Steroid injections in the area of the herniated disk. This can help reduce pain and swelling.  Physical therapy to strengthen your back muscles.  In many cases, symptoms go away with treatment over a period of days or weeks. You will most likely be free of symptoms after 3-4 months. If other treatments do not help to relieve your symptoms, you may need surgery. Follow these instructions at home: Medicines  Take over-the-counter and prescription medicines only as told by your health care provider.  Do not drive or use heavy machinery while taking prescription pain medicine. Activity  Rest as directed.  After your rest period: ? Return to your normal activities and gradually begin exercising as told by your health care provider. Ask your health care provider what activities and exercises are safe for you. ? Use  good posture. ? Avoid movements that cause pain. ? Do not lift anything that is heavier than 10 lb (4.5 kg) until your health care provider says this is safe. ? Do not sit or stand for long periods of time without changing positions. ? Do not sit for long periods of time without getting up and moving around.  If physical therapy was prescribed, do exercises as instructed.  Aim to strengthen muscles in your back and abdomen with exercises like crunches, swimming, or walking. General instructions  Do not use any products that contain nicotine or tobacco, such as cigarettes and e-cigarettes. These products can delay healing. If you need help quitting, ask your health care provider.  Do not wear high-heeled shoes.  Do not sleep on your belly.  If you are overweight, work with your health care provider to lose weight safely.  To prevent or treat constipation while you are taking prescription pain medicine, your health care provider may recommend that you: ? Drink enough fluid to keep your urine clear or pale yellow. ? Take over-the-counter or prescription medicines. ? Eat foods that are high in fiber, such as fresh fruits and vegetables, whole grains, and beans. ? Limit foods that are high in fat and processed sugars, such as fried and sweet foods.  Keep all follow-up visits as told by your health care provider. This is important. How is this prevented?  Maintain a healthy weight.  Try to avoid stressful situations.  Maintain physical fitness. Do at least 150 minutes of moderate-intensity exercise each week, such as brisk walking or water aerobics.  When lifting objects: ? Keep your feet at least shoulder-width apart and tighten your abdominal muscles. ? Keep your spine neutral as you bend your knees and hips. It is important to lift using the strength of your legs, not your back. Do not lock your knees straight out. ? Always ask for help to lift heavy or awkward objects. Contact a  health  care provider if:  You have back pain or neck pain that does not get better after 6 weeks.  You have severe pain in your back, neck, legs, or arms.  You develop numbness, tingling, or weakness in any part of your body. Get help right away if:  You cannot move your arms or legs.  You cannot control when you urinate or have bowel movements.  You feel dizzy or you faint.  You have shortness of breath. This information is not intended to replace advice given to you by your health care provider. Make sure you discuss any questions you have with your health care provider. Document Released: 06/16/2000 Document Revised: 02/14/2016 Document Reviewed: 02/14/2016 Elsevier Interactive Patient Education  [employer].   If you have lab work done today you will be contacted with your lab results within the next 2 weeks.  If you have not heard from [place] then please contact us. The fastest way to get your results is to register for My Chart.   IF you received an x-ray today, you will receive an invoice from [facility]. Please contact [facility] at [phone] with questions or concerns regarding your invoice.   IF you received labwork today, you will receive an invoice from [facility]. Please contact [facility] at [phone] with questions or concerns regarding your invoice.   Our billing staff will not be able to assist you with questions regarding bills from these companies.  You will be contacted with the lab results as soon as they are available. The fastest way to get your results is to activate your My Chart account. Instructions are located on the last page of this paperwork. If you have not heard from [place] regarding the results in 2 weeks, please contact this office.

## 2018-02-25 NOTE — Telephone Encounter (Signed)
Copied from [place] [id number]. Topic: General - Other >> Feb 25, 2018 12:28 PM Carolyn Stare wrote: Pt medicine was sent to the wrong pharmacy please resend to [facility]    cyclobenzaprine (FLEXERIL) 5 MG tablet   HYDROcodone-acetaminophen (NORCO/VICODIN) 5-325 MG tablet   predniSONE (DELTASONE) 20 MG tablet

## 2018-02-25 NOTE — Progress Notes (Signed)
Subjective:  By signing my name below, I, [name], attest that this documentation has been prepared under the direction and in the presence of Wendie Agreste, MD Electronically Signed: Ladene Artist, ED Scribe 02/25/2018 at 11:01 AM.   Patient ID: Dillon Reeves, male    DOB: 08-Dec-1960, 57 y.o.   MRN: [id number]  Chief Complaint  Patient presents with  . left hip to leg pain    severe cramping pain for the last 2 days. know it may be due to the herniated disc of L5 pressing on s1 nerve   HPI Dillon Reeves is a 57 y.o. male who presents to Primary Care at [facility] complaining of L leg pain. He has been seen with low back pain in April, radiating down L knee, thigh and foot. Initially treated with Voltaren and flexeril. Initial XR was neg. Seen by [facility] Dr. Erlinda Hong on 5/10 with L-sided sciatica treated with prednisone, Robaxin, Celebrex. MRI of lumbar spine 6/10 with large L disc extrusion at L5-S1, impinging L S1 nerve root and shallow disc protrusion L4-5 without stenosis or neural impingement. Saw Dr. Ernestina Patches 6/18 with S1 epidural steroid injection. Planned for 10 day notification of status to determine need for rpt injection.  Today, pt presents with daily severe L hip pain radiating to L leg x 2 days. Denies injury, twist or fall but does report some lifting of frozen foods at work and lifting weights in the gym last wk. Pt states that this is the most pain that he has experienced since onset. He does report that he is now experiencing some numbness in the L foot and subjective weakness in L LE. He has tried 4 Motrin tabs and Aleve without significant relief. Denies bladder/bowel incontinence, saddle anesthesia. Pt states that voltaren, robaxin and celebrex provided some relief. He states that prior to injection, he had pain radiating from L buttock into L thigh and foot. After injection, pain improved some and radiated from L buttock to L thigh, but not L foot.  No  history of diabetes/elevated blood sugar.   Patient Active Problem List   Diagnosis Date Noted  . Acute left-sided low back pain with left-sided sciatica 10/30/2016  . 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 Allergy   . Anxiety   . Metacarpal bone fracture    5th finger   Past Surgical History:  Procedure Laterality Date  . OPEN REDUCTION INTERNAL FIXATION (ORIF) HAND Right 01/29/2015   Procedure: OPEN TREATMENT RIGHT FIFTH METACARPAL FRACTURE;  Surgeon: Milly Jakob, MD;  Location: [place];  Service: Orthopedics;  Laterality: Right;   No Known Allergies Prior to Admission medications   Medication Sig Start Date End Date Taking? Authorizing Provider  celecoxib (CELEBREX) 200 MG capsule Take 1 capsule (200 mg total) by mouth 2 (two) times daily. Patient not taking: Reported on 11/09/2016 11/09/16   Leandrew Koyanagi, MD  methocarbamol (ROBAXIN) 500 MG tablet Take 1 tablet (500 mg total) by mouth every 6 (six) hours as needed for muscle spasms. Patient not taking: Reported on 11/09/2016 11/09/16   Leandrew Koyanagi, MD  predniSONE (STERAPRED UNI-PAK 21 TAB) 10 MG (21) TBPK tablet Take as directed Patient not taking: Reported on 11/09/2016 11/09/16   Leandrew Koyanagi, MD  rifampin (RIFADIN) 300 MG capsule Take by mouth 2 (two) times daily.    [provider]  traMADol (ULTRAM) 50 MG tablet Take 1 tablet (50 mg total)  by mouth every 8 (eight) hours. 12/05/16   Leandrew Koyanagi, MD   Social History   Socioeconomic History  . Marital status: Married    Spouse name: separated  . Number of children: 3  . Years of education: 2+ years college  . Highest education level: Not on file  Occupational History  . Occupation: delivery truck Education administrator: [employer]  . Occupation: bus Education administrator: [employer]  . Financial resource strain: Not on file  . Food insecurity:    Worry: Not on file    Inability: Not on  file  . Transportation needs:    Medical: Not on file    Non-medical: Not on file  Tobacco Use  . Smoking status: Never Smoker  . Smokeless tobacco: Never Used  Substance and Sexual Activity  . Alcohol use: Yes    Comment: once or twice every two weeks  . Drug use: No  . Sexual activity: Yes  Lifestyle  . Physical activity:    Days per week: Not on file    Minutes per session: Not on file  . Stress: Not on file  Relationships  . Social connections:    Talks on phone: Not on file    Gets together: Not on file    Attends religious service: Not on file    Active member of club or organization: Not on file    Attends meetings of clubs or organizations: Not on file    Relationship status: Not on file  . Intimate partner violence:    Fear of current or ex partner: Not on file    Emotionally abused: Not on file    Physically abused: Not on file    Forced sexual activity: Not on file  Other Topics Concern  . Not on file  Social History Narrative   Lives with his wife and their children.   Separating from wife (08/2016).   Review of Systems  Musculoskeletal: Positive for back pain.  Neurological: Positive for weakness (subjective) and numbness (L foot).      Objective:   Physical Exam  Constitutional: He is oriented to person, place, and time. He appears well-developed and well-nourished. No distress.  HENT:  Head: Normocephalic and atraumatic.  Eyes: Conjunctivae and EOM are normal.  Neck: Neck supple. No tracheal deviation present.  Cardiovascular: Normal rate.  Pulmonary/Chest: Effort normal. No respiratory distress.  Musculoskeletal:  No midline l-spine tenderness. Tenderness along L paraspinals along the L sciatic notch. Positive seated straight leg raise with pain radiating along L thigh. Dorsi and plantar flexion intact. Flexion 70 degrees. Extension limited to even only. Guarded with heel and toe due to pain but independent testing of strength appears to be intact of  LEs.  Neurological: He is alert and oriented to person, place, and time. He displays no Babinski's sign on the left side.  Reflex Scores:      Patellar reflexes are 2+ on the left side.      Achilles reflexes are 2+ on the left side. Skin: Skin is warm and dry.  Psychiatric: He has a normal mood and affect. His behavior is normal.  Nursing note and vitals reviewed.  Vitals:   02/25/18 1007 02/25/18 1011  BP: (!) 162/122 (!) 142/100  Pulse: 71   Temp: 98.7 F (37.1 C)   TempSrc: Oral   SpO2: 100%   Weight: 179 lb 12.8 oz (81.6 kg)   Height: 5\' 11"  (  1.803 m)       Assessment & Plan:    Dillon Reeves is a 57 y.o. male Left-sided low back pain with left-sided sciatica, unspecified chronicity - Plan: predniSONE (DELTASONE) 20 MG tablet, cyclobenzaprine (FLEXERIL) 5 MG tablet, HYDROcodone-acetaminophen (NORCO/VICODIN) 5-325 MG tablet, Ambulatory referral to Spine Surgery  Herniation of intervertebral disc between L5 and S1 - Plan: predniSONE (DELTASONE) 20 MG tablet, cyclobenzaprine (FLEXERIL) 5 MG tablet, HYDROcodone-acetaminophen (NORCO/VICODIN) 5-325 MG tablet, Ambulatory referral to Spine Surgery   Flair of sciatica from HNP as noted above. No red flags on exam/history and flair of prior similar symptoms.   - prednisone taper, side effects.risks discussed.   - flexeril for spasm, hydrocodone if needed - additive side effects/risks discussed.   -  refer to ortho back specialist for second opinion on treatment options.   - rtc/er precautions if worsening.   Elevated BP likely related to pain.  Monitor home readings, RTC precautions if elevated and recheck in 2 weeks.   Meds ordered this encounter  Medications  . predniSONE (DELTASONE) 20 MG tablet    Sig: 3 by mouth for 3 days, then 2 by mouth for 2 days, then 1 by mouth for 2 days, then 1/2 by mouth for 2 days.    Dispense:  16 tablet    Refill:  0  . cyclobenzaprine (FLEXERIL) 5 MG tablet    Sig: Take 1 tablet (5 mg  total) by mouth 3 (three) times daily as needed for muscle spasms (start qhs prn due to sedation).    Dispense:  15 tablet    Refill:  1  . HYDROcodone-acetaminophen (NORCO/VICODIN) 5-325 MG tablet    Sig: Take 0.5-1 tablets by mouth every 6 (six) hours as needed for moderate pain.    Dispense:  15 tablet    Refill:  0   Patient Instructions   Current symptoms appear to be due to a flare of sciatica from the pinched nerve, disc herniation.  We can try prednisone one more time along with the muscle relaxant Flexeril up to every 8 hours.  Start that at bedtime due to sedation and do not drive or operate machinery while you are taking that medication.  If you need additional medication to help with pain, I did write a short course of hydrocodone, but that also can cause sedation and dizziness so be careful combining that with the muscle relaxant.  I will refer you to the back specialist, but if you are having worsening symptoms prior to that visit, return here or the emergency room.  Return to the clinic or go to the nearest emergency room if any of your symptoms worsen or new symptoms occur.  Keep a record of your blood pressures outside of the office andif over 140/90 as pain is controlled better, return to discuss meds. We can recheck that as well in next 1-2 weeks.     Sciatica Sciatica is pain, numbness, weakness, or tingling along the path of the sciatic nerve. The sciatic nerve starts in the lower back and runs down the back of each leg. The nerve controls the muscles in the lower leg and in the back of the knee. It also provides feeling (sensation) to the back of the thigh, the lower leg, and the sole of the foot. Sciatica is a symptom of another medical condition that pinches or puts pressure on the sciatic nerve. Generally, sciatica only affects one side of the body. Sciatica usually goes away on its own or  with treatment. In some cases, sciatica may keep coming back (recur). What are the  causes? This condition is caused by pressure on the sciatic nerve, or pinching of the sciatic nerve. This may be the result of:  A disk in between the bones of the spine (vertebrae) bulging out too far (herniated disk).  Age-related changes in the spinal disks (degenerative disk disease).  A pain disorder that affects a muscle in the buttock (piriformis syndrome).  Extra bone growth (bone spur) near the sciatic nerve.  An injury or break (fracture) of the pelvis.  Pregnancy.  Tumor (rare).  What increases the risk? The following factors may make you more likely to develop this condition:  Playing sports that place pressure or stress on the spine, such as football or weight lifting.  Having poor strength and flexibility.  A history of back injury.  A history of back surgery.  Sitting for long periods of time.  Doing activities that involve repetitive bending or lifting.  Obesity.  What are the signs or symptoms? Symptoms can vary from mild to very severe, and they may include:  Any of these problems in the lower back, leg, hip, or buttock: ? Mild tingling or dull aches. ? Burning sensations. ? Sharp pains.  Numbness in the back of the calf or the sole of the foot.  Leg weakness.  Severe back pain that makes movement difficult.  These symptoms may get worse when you cough, sneeze, or laugh, or when you sit or stand for long periods of time. Being overweight may also make symptoms worse. In some cases, symptoms may recur over time. How is this diagnosed? This condition may be diagnosed based on:  Your symptoms.  A physical exam. Your health care provider may ask you to do certain movements to check whether those movements trigger your symptoms.  You may have tests, including: ? Blood tests. ? X-rays. ? MRI. ? CT scan.  How is this treated? In many cases, this condition improves on its own, without any treatment. However, treatment may include:  Reducing  or modifying physical activity during periods of pain.  Exercising and stretching to strengthen your abdomen and improve the flexibility of your spine.  Icing and applying heat to the affected area.  Medicines that help: ? To relieve pain and swelling. ? To relax your muscles.  Injections of medicines that help to relieve pain, irritation, and inflammation around the sciatic nerve (steroids).  Surgery.  Follow these instructions at home: Medicines  Take over-the-counter and prescription medicines only as told by your health care provider.  Do not drive or operate heavy machinery while taking prescription pain medicine. Managing pain  If directed, apply ice to the affected area. ? Put ice in a plastic bag. ? Place a towel between your skin and the bag. ? Leave the ice on for 20 minutes, 2-3 times a day.  After icing, apply heat to the affected area before you exercise or as often as told by your health care provider. Use the heat source that your health care provider recommends, such as a moist heat pack or a heating pad. ? Place a towel between your skin and the heat source. ? Leave the heat on for 20-30 minutes. ? Remove the heat if your skin turns bright red. This is especially important if you are unable to feel pain, heat, or cold. You may have a greater risk of getting burned. Activity  Return to your normal activities as told by  your health care provider. Ask your health care provider what activities are safe for you. ? Avoid activities that make your symptoms worse.  Take brief periods of rest throughout the day. Resting in a lying or standing position is usually better than sitting to rest. ? When you rest for longer periods, mix in some mild activity or stretching between periods of rest. This will help to prevent stiffness and pain. ? Avoid sitting for long periods of time without moving. Get up and move around at least one time each hour.  Exercise and stretch  regularly, as told by your health care provider.  Do not lift anything that is heavier than 10 lb (4.5 kg) while you have symptoms of sciatica. When you do not have symptoms, you should still avoid heavy lifting, especially repetitive heavy lifting.  When you lift objects, always use proper lifting technique, which includes: ? Bending your knees. ? Keeping the load close to your body. ? Avoiding twisting. General instructions  Use good posture. ? Avoid leaning forward while sitting. ? Avoid hunching over while standing.  Maintain a healthy weight. Excess weight puts extra stress on your back and makes it difficult to maintain good posture.  Wear supportive, comfortable shoes. Avoid wearing high heels.  Avoid sleeping on a mattress that is too soft or too hard. A mattress that is firm enough to support your back when you sleep may help to reduce your pain.  Keep all follow-up visits as told by your health care provider. This is important. Contact a health care provider if:  You have pain that wakes you up when you are sleeping.  You have pain that gets worse when you lie down.  Your pain is worse than you have experienced in the past.  Your pain lasts longer than 4 weeks.  You experience unexplained weight loss. Get help right away if:  You lose control of your bowel or bladder (incontinence).  You have: ? Weakness in your lower back, pelvis, buttocks, or legs that gets worse. ? Redness or swelling of your back. ? A burning sensation when you urinate. This information is not intended to replace advice given to you by your health care provider. Make sure you discuss any questions you have with your health care provider. Document Released: 06/13/2001 Document Revised: 11/23/2015 Document Reviewed: 02/26/2015 Elsevier Interactive Patient Education  2018 [place].  Herniated Disk A herniated disk, also called a ruptured disk or slipped disk, occurs when a disk in the  spine bulges out too far. Between the bones in the spine (vertebrae), there are oval disks that are made of a soft, spongy center that is surrounded by a tough outer ring. The disks connect your vertebrae, help your spine move, and absorb shocks from your movement. When you have a herniated disk, the spongy center of the disk bulges out or breaks through the outer ring. It can press on a nerve between the vertebrae and cause pain. This can occur anywhere in the back or neck area, but the lower back is most commonly affected. What are the causes? This condition may be caused by:  Age-related wear and tear. The spongy centers of spinal disks tend to shrink and dry out with age, which makes them more likely to herniate.  Sudden injury, such as a strain or sprain.  What increases the risk? Aging is the main risk factor for a herniated disk. Other risk factors include:  Being a man who is 25-69 years old.  Frequently doing activities that involve heavy lifting, bending, or twisting.  Frequently driving for long hours at a time.  Not getting enough exercise.  Being overweight.  Smoking.  Having a family history of back problems or herniated disks.  Being pregnant or giving birth.  Having poor nutrition.  Being tall.  What are the signs or symptoms? Symptoms may vary depending on where your herniated disk is located.  A herniated disk in the lower back may cause sharp pain in: ? Part of the arm, leg, hip, or buttocks. ? The back of the lower leg (calf). ? The lower back, spreading down through the leg into the foot (sciatica).  A herniated disk in the neck may cause dizziness and vertigo. It may also cause pain or weakness in: ? The neck. ? The shoulder blades. ? Upper arm, forearm, or fingers.  You may also have muscle weakness. It may be difficult to: ? Lift your leg or arm. ? Stand on your toes. ? Squeeze tightly with one of your hands.  Other symptoms may  include: ? Numbness or tingling in the affected areas of the hands, arms, feet, or legs. ? Inability to control when you urinate or when you have bowel movements. This is a rare but serious sign of a severe herniated disk in the lower back.  How is this diagnosed? This condition may be diagnosed based on:  Your symptoms.  Your medical history.  A physical exam. The exam may include: ? Straight-leg test. You will lie on your back while your health care provider lifts your leg, keeping your knee straight. If you feel pain, you likely have a herniated disk. ? Neurological tests. This includes checking for numbness, reflexes, muscle strength, and posture.  Imaging tests, such as: ? X-rays. ? MRI. ? CT scan. ? Electromyogram (EMG) to check the nerves that control muscles. This test may be used to determine which nerves are affected by your herniated disk.  How is this treated? Treatment for this condition may include:  A short period of rest. This is usually the first treatment. ? You may be on bed rest for up to 2 days, or you may be instructed to stay home and avoid physical activity. ? If you have a herniated disk in your lower back, avoid sitting as much as possible. Sitting increases pressure on the disk.  Medicines. These may include: ? NSAIDs to help reduce pain and swelling. ? Muscle relaxants to prevent sudden tightening of the back muscles (back spasms). ? Prescription pain medicines, if you have severe pain.  Steroid injections in the area of the herniated disk. This can help reduce pain and swelling.  Physical therapy to strengthen your back muscles.  In many cases, symptoms go away with treatment over a period of days or weeks. You will most likely be free of symptoms after 3-4 months. If other treatments do not help to relieve your symptoms, you may need surgery. Follow these instructions at home: Medicines  Take over-the-counter and prescription medicines only as  told by your health care provider.  Do not drive or use heavy machinery while taking prescription pain medicine. Activity  Rest as directed.  After your rest period: ? Return to your normal activities and gradually begin exercising as told by your health care provider. Ask your health care provider what activities and exercises are safe for you. ? Use good posture. ? Avoid movements that cause pain. ? Do not lift anything that is heavier than  10 lb (4.5 kg) until your health care provider says this is safe. ? Do not sit or stand for long periods of time without changing positions. ? Do not sit for long periods of time without getting up and moving around.  If physical therapy was prescribed, do exercises as instructed.  Aim to strengthen muscles in your back and abdomen with exercises like crunches, swimming, or walking. General instructions  Do not use any products that contain nicotine or tobacco, such as cigarettes and e-cigarettes. These products can delay healing. If you need help quitting, ask your health care provider.  Do not wear high-heeled shoes.  Do not sleep on your belly.  If you are overweight, work with your health care provider to lose weight safely.  To prevent or treat constipation while you are taking prescription pain medicine, your health care provider may recommend that you: ? Drink enough fluid to keep your urine clear or pale yellow. ? Take over-the-counter or prescription medicines. ? Eat foods that are high in fiber, such as fresh fruits and vegetables, whole grains, and beans. ? Limit foods that are high in fat and processed sugars, such as fried and sweet foods.  Keep all follow-up visits as told by your health care provider. This is important. How is this prevented?  Maintain a healthy weight.  Try to avoid stressful situations.  Maintain physical fitness. Do at least 150 minutes of moderate-intensity exercise each week, such as brisk walking or  water aerobics.  When lifting objects: ? Keep your feet at least shoulder-width apart and tighten your abdominal muscles. ? Keep your spine neutral as you bend your knees and hips. It is important to lift using the strength of your legs, not your back. Do not lock your knees straight out. ? Always ask for help to lift heavy or awkward objects. Contact a health care provider if:  You have back pain or neck pain that does not get better after 6 weeks.  You have severe pain in your back, neck, legs, or arms.  You develop numbness, tingling, or weakness in any part of your body. Get help right away if:  You cannot move your arms or legs.  You cannot control when you urinate or have bowel movements.  You feel dizzy or you faint.  You have shortness of breath. This information is not intended to replace advice given to you by your health care provider. Make sure you discuss any questions you have with your health care provider. Document Released: 06/16/2000 Document Revised: 02/14/2016 Document Reviewed: 02/14/2016 Elsevier Interactive Patient Education  [employer].   If you have lab work done today you will be contacted with your lab results within the next 2 weeks.  If you have not heard from [place] then please contact us. The fastest way to get your results is to register for My Chart.   IF you received an x-ray today, you will receive an invoice from [facility] Rad[facility]. Please contact [facility] at [phone] with questions or concerns regarding your invoice.   IF you received labwork today, you will receive an invoice from [place]. Please contact [facility] at [phone] with questions or concerns regarding your invoice.   Our billing staff will not be able to assist you with questions regarding bills from these companies.  You will be contacted with the lab results as soon as they are available. The fastest way to get your results is to activate your My Chart  account. Instructions are  located on the last page of this paperwork. If you have not heard from [place] regarding the results in 2 weeks, please contact this office.       I personally performed the services described in this documentation, which was scribed in my presence. The recorded information has been reviewed and considered for accuracy and completeness, addended by me as needed, and agree with information above.  Signed,   Merri Ray, MD [facility] at [place].  02/25/18 10:28 PM

## 2018-02-26 NOTE — Telephone Encounter (Signed)
Called pharmacy prescription were transferred

## 2018-02-26 NOTE — Telephone Encounter (Signed)
Copied from [place] [phone]. Topic: General - Other >> Feb 26, 2018  3:26 PM Yvette Rack wrote: Reason for CRM: pt is calling for a work note to be out until the end of the week due to his back pain he states that he need to notes because he has two jobs his girlfriend Excell Seltzer will be picking up the note for him please give him a call back at [phone]

## 2018-02-27 ENCOUNTER — Telehealth: Payer: Self-pay | Admitting: Family Medicine

## 2018-02-27 NOTE — Telephone Encounter (Signed)
Copied from [place] [phone]. Topic: General - Other >> Feb 27, 2018 12:30 PM Lennox Solders wrote: Reason for CRM: pt saw dr Carlota Raspberry on 8/26 for  back pain and pt is calling to let md know the cyclobenzaprine and hydrocodone is not working .  Pt would like something else send to [facility]. Pt can not see orthopaedic until 03-08-18 dr Lynann Bologna. Pt will call their office to see if he can get on cancellation list

## 2018-02-27 NOTE — Telephone Encounter (Signed)
Letters printed - I will sign for pickup later today.

## 2018-02-28 NOTE — Telephone Encounter (Signed)
Attempted to call pt and gather more info. There was no answer and unidentified VM so I did not leave a message.   Dr. Nyoka Cowden please see message below and advise on if there is any additional options.  Margretta Ditty

## 2018-03-01 MED ORDER — HYDROCODONE-ACETAMINOPHEN 5-325 MG PO TABS: 1.0000 | ORAL_TABLET | Freq: Four times a day (QID) | ORAL | 0 refills | Status: DC | PRN

## 2018-03-01 NOTE — Telephone Encounter (Signed)
Feels like pain is not improving with prednisone.  Taking flexeril 5mg  tid, hydrocodone only once or twice per day (does not feel like it is helping). 5 days into 9 taper.  Pulsating pain in buttock at sciatic area.  Numbness in foot feels a little worse. No bowel or bladder incontinence, no saddle anesthesia, no lower extremity weakness. Has been using crutches, pain more than weakness. No fever, no rash.   Can try 1-2 of the hydrocodone at a time, up to every 6 hours. New Rx sent. Still on prednisone taper, but with disc extrusion, may need surgical treatment. Has follow up with me in 4 days, but if worsening over the weekend - advised ER eval.

## 2018-03-02 ENCOUNTER — Other Ambulatory Visit: Payer: Self-pay | Admitting: Family Medicine

## 2018-03-02 MED ORDER — HYDROCODONE-ACETAMINOPHEN 5-325 MG PO TABS: 1.0000 | ORAL_TABLET | Freq: Four times a day (QID) | ORAL | 0 refills | Status: DC | PRN

## 2018-03-02 NOTE — Progress Notes (Signed)
Reordered hydrocodone as printed previously. Can shred printed Rx.

## 2018-03-05 ENCOUNTER — Ambulatory Visit: Payer: BC Managed Care – PPO | Admitting: Family Medicine

## 2018-03-05 ENCOUNTER — Other Ambulatory Visit: Payer: Self-pay

## 2018-03-05 ENCOUNTER — Encounter: Payer: Self-pay | Admitting: Family Medicine

## 2018-03-05 VITALS — BP 129/85 | HR 87 | Temp 97.9°F | Resp 16 | Ht 71.0 in | Wt 179.0 lb

## 2018-03-05 DIAGNOSIS — M5127 Other intervertebral disc displacement, lumbosacral region: Secondary | ICD-10-CM | POA: Diagnosis not present

## 2018-03-05 DIAGNOSIS — M5432 Sciatica, left side: Secondary | ICD-10-CM

## 2018-03-05 MED ORDER — OXYCODONE-ACETAMINOPHEN 5-325 MG PO TABS: 1.0000 | ORAL_TABLET | Freq: Four times a day (QID) | ORAL | 0 refills | Status: DC | PRN

## 2018-03-05 NOTE — Progress Notes (Signed)
Subjective:  By signing my name below, I, Dillon Reeves, attest that this documentation has been prepared under the direction and in the presence of Wendie Agreste, MD Electronically Signed: Ladene Artist, ED Scribe 03/05/2018 at 10:18 AM.   Patient ID: Dillon Reeves, male    DOB: Jan 01, 1961, 57 y.o.   MRN: [id number]  Chief Complaint  Patient presents with  . Left Leg Pain    1 week follow-up, pt states the left leg and foot feels worst with some swelling present    HPI Dillon Reeves is a 57 y.o. male who presents to Primary Care at [facility] for f/u of L-sided sciatica with prior disc protrusion L4-5, disc extrusion L5-S1 noted on prior l-spine MRI. Epidural injection 6/18. When seen on 8/26 he had worsening symptoms x prior 2 days with numbness in L foot, subjective weakness LLE. Based on prior improvement with treatment, started on trial of pre 60 mg to 10 mg taper x 9 days. Flexeril and hydrocodone. Spoke to him 4 days ago, minimal improvement with prednisone, taking hydrocodone a few times/day. Had a few doses of prednisone left. hydrocodone increased to 2 at a time if needed. Was referred to Dr. Lynann Bologna.  Pt reports that he is having gradually worsening pain radiating from the L buttock to the L foot. He noticed L foot swelling and worsening L foot numbness. Pt has been ambulating with crutches; states he is unsure if difficulty ambulating is due to increase L foot pain or subjective L leg weakness. He reports no relief with prednisone or 2 hydrocodone tabs together; last dose was yesterday. Denies fever, bladder/bowel incontinence, saddle anesthesia. Pt has an appointment with Dr. Lynann Bologna scheduled for 9/6.  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  Patient Active Problem List   Diagnosis Date Noted  . Acute left-sided low back pain with left-sided sciatica 10/30/2016  . 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 Allergy   . Anxiety   . Metacarpal bone fracture    5th finger   Past Surgical History:  Procedure Laterality Date  . OPEN REDUCTION INTERNAL FIXATION (ORIF) HAND Right 01/29/2015   Procedure: OPEN TREATMENT RIGHT FIFTH METACARPAL FRACTURE;  Surgeon: Milly Jakob, MD;  Location: [place];  Service: Orthopedics;  Laterality: Right;   No Known Allergies Prior to Admission medications   Medication Sig Start Date End Date Taking? Authorizing Provider  cyclobenzaprine (FLEXERIL) 5 MG tablet Take 1 tablet (5 mg total) by mouth 3 (three) times daily as needed for muscle spasms (start qhs prn due to sedation). 02/25/18   Wendie Agreste, MD  HYDROcodone-acetaminophen (NORCO/VICODIN) 5-325 MG tablet Take 1-2 tablets by mouth every 6 (six) hours as needed for moderate pain. 03/02/18   Wendie Agreste, MD  ibuprofen (ADVIL,MOTRIN) 200 MG tablet Take 400 mg by mouth every 6 (six) hours as needed.    [provider]  predniSONE (DELTASONE) 20 MG tablet 3 by mouth for 3 days, then 2 by mouth for 2 days, then 1 by mouth for 2 days, then 1/2 by mouth for 2 days. 02/25/18   Wendie Agreste, MD   Social History   Socioeconomic History  . Marital status: Married    Spouse name: separated  . Number of children: 3  . Years of education: 2+ years college  . Highest education level: Not  on file  Occupational History  . Occupation: delivery truck Education administrator: [employer]  . Occupation: bus Education administrator: [employer]  . Financial resource strain: Not on file  . Food insecurity:    Worry: Not on file    Inability: Not on file  . Transportation needs:    Medical: Not on file    Non-medical: Not on file  Tobacco Use  . Smoking status: Never Smoker  . Smokeless tobacco: Never Used  Substance and Sexual Activity  . Alcohol use: Yes    Comment: once or twice every two weeks    . Drug use: No  . Sexual activity: Yes  Lifestyle  . Physical activity:    Days per week: Not on file    Minutes per session: Not on file  . Stress: Not on file  Relationships  . Social connections:    Talks on phone: Not on file    Gets together: Not on file    Attends religious service: Not on file    Active member of club or organization: Not on file    Attends meetings of clubs or organizations: Not on file    Relationship status: Not on file  . Intimate partner violence:    Fear of current or ex partner: Not on file    Emotionally abused: Not on file    Physically abused: Not on file    Forced sexual activity: Not on file  Other Topics Concern  . Not on file  Social History Narrative   Lives with his wife and their children.   Separating from wife (08/2016).   Review of Systems  Constitutional: Negative for fever.  Musculoskeletal: Positive for back pain and myalgias.  Neurological: Positive for weakness (subjective; L foot) and numbness (L foot).       Objective:   Physical Exam  Constitutional: He is oriented to person, place, and time. He appears well-developed and well-nourished. No distress.  HENT:  Head: Normocephalic and atraumatic.  Eyes: Conjunctivae and EOM are normal.  Neck: Neck supple. No tracheal deviation present.  Cardiovascular: Normal rate.  Pulmonary/Chest: Effort normal. No respiratory distress.  Musculoskeletal:  L foot: cap refill less than 1 second. Foot circumference: 36 on L, 35 on R. No focal swelling about ankle or foot. Complains of primary numbness along lateral foot into 4th and 5th toes. Pos seated straight leg raise. Guarded with stance. Unable to check strength testing due to pain.  Neurological: He is alert and oriented to person, place, and time. He displays no Babinski's sign on the left side.  Reflex Scores:      Patellar reflexes are 2+ on the right side and 2+ on the left side. Difficulty with achilles reflex on L. Decreased  achilles bilat.  Skin: Skin is warm and dry.  Psychiatric: He has a normal mood and affect. His behavior is normal.  Nursing note and vitals reviewed.  Vitals:   03/05/18 0950  BP: 129/85  Pulse: 87  Resp: 16  Temp: 97.9 F (36.6 C)  TempSrc: Oral  SpO2: 99%  Weight: 179 lb (81.2 kg)  Height: 5\' 11"  (1.803 m)      Assessment & Plan:   Dillon Reeves is a 57 y.o. male Left sided sciatica - Plan: oxyCODONE-acetaminophen (PERCOCET) 5-325 MG tablet  Herniated nucleus pulposus, L5-S1, left - Plan: oxyCODONE-acetaminophen (PERCOCET) 5-325 MG tablet  Persistent/worsening pain with numbness down left leg, with  S1 impingement from disc extrusion likely by exam and prior MRI. Did not experience significant change with prednisone, and minimal relief of pain with hydrocodone.  Denies any symptoms of cauda equina syndrome at this time.  -Call placed to orthopedic surgeon, has appointment in 3 days but will discuss if new treatment for sooner evaluation needed.  Held on further prednisone at this time.  -Percocet given for improved pain control, potential side effects discussed.  -Note provided for work.  Further plan to be determined after phone call with orthopedist.   Meds ordered this encounter  Medications  . oxyCODONE-acetaminophen (PERCOCET) 5-325 MG tablet    Sig: Take 1-2 tablets by mouth every 6 (six) hours as needed for severe pain.    Dispense:  20 tablet    Refill:  0   Patient Instructions     I have placed a call to the orthopedic surgeon, but waiting for him to return call later today.  For now take Percocet 1-2 every 6 hours as needed, okay to continue crutches and minimize weightbearing to the left leg for now.  Once I receive more information from orthopedics, I will let you know.   For now I would keep the follow-up appointment in 3 days unless he instructs me otherwise.  If you are unable to achieve pain control, or worsening symptoms, proceed to the emergency  room.  Thank you for coming in today.  If you have lab work done today you will be contacted with your lab results within the next 2 weeks.  If you have not heard from [place] then please contact us. The fastest way to get your results is to register for My Chart.   IF you received an x-ray today, you will receive an invoice from [facility]. Please contact [facility] at [phone] with questions or concerns regarding your invoice.   IF you received labwork today, you will receive an invoice from [place]. Please contact [facility] at [phone] with questions or concerns regarding your invoice.   Our billing staff will not be able to assist you with questions regarding bills from these companies.  You will be contacted with the lab results as soon as they are available. The fastest way to get your results is to activate your My Chart account. Instructions are located on the last page of this paperwork. If you have not heard from [place] regarding the results in 2 weeks, please contact this office.       I personally performed the services described in this documentation, which was scribed in my presence. The recorded information has been reviewed and considered for accuracy and completeness, addended by me as needed, and agree with information above.  Signed,   Merri Ray, MD Primary Care at [place].  03/05/18 11:25 AM

## 2018-03-05 NOTE — Patient Instructions (Addendum)
   I have placed a call to the orthopedic surgeon, but waiting for him to return call later today.  For now take Percocet 1-2 every 6 hours as needed, okay to continue crutches and minimize weightbearing to the left leg for now.  Once I receive more information from orthopedics, I will let you know.   For now I would keep the follow-up appointment in 3 days unless he instructs me otherwise.  If you are unable to achieve pain control, or worsening symptoms, proceed to the emergency room.  Thank you for coming in today.  If you have lab work done today you will be contacted with your lab results within the next 2 weeks.  If you have not heard from [place] then please contact us. The fastest way to get your results is to register for My Chart.   IF you received an x-ray today, you will receive an invoice from [facility]. Please contact [facility] at [phone] with questions or concerns regarding your invoice.   IF you received labwork today, you will receive an invoice from [place]. Please contact [facility] at [phone] with questions or concerns regarding your invoice.   Our billing staff will not be able to assist you with questions regarding bills from these companies.  You will be contacted with the lab results as soon as they are available. The fastest way to get your results is to activate your My Chart account. Instructions are located on the last page of this paperwork. If you have not heard from [place] regarding the results in 2 weeks, please contact this office.

## 2018-03-08 ENCOUNTER — Telehealth: Payer: Self-pay | Admitting: Family Medicine

## 2018-03-08 NOTE — Telephone Encounter (Signed)
Copied from [employer] [id number]. Topic: General - Other >> Mar 08, 2018  9:24 AM Synthia Innocent wrote: Reason for CRM: Dr Lynann Bologna is returning call to Dr Carlota Raspberry, aware provider is out of office today

## 2018-03-18 ENCOUNTER — Other Ambulatory Visit: Payer: Self-pay | Admitting: Orthopedic Surgery

## 2018-03-19 ENCOUNTER — Encounter (HOSPITAL_COMMUNITY): Payer: Self-pay | Admitting: *Deleted

## 2018-03-19 ENCOUNTER — Other Ambulatory Visit: Payer: Self-pay

## 2018-03-20 ENCOUNTER — Ambulatory Visit (HOSPITAL_COMMUNITY): Payer: BC Managed Care – PPO

## 2018-03-20 ENCOUNTER — Encounter (HOSPITAL_COMMUNITY): Payer: Self-pay | Admitting: Certified Registered Nurse Anesthetist

## 2018-03-20 ENCOUNTER — Ambulatory Visit (HOSPITAL_COMMUNITY)
Admission: RE | Admit: 2018-03-20 | Discharge: 2018-03-20 | Disposition: A | Discharge status: Home / Self Care | Payer: BC Managed Care – PPO | Source: Ambulatory Visit | Attending: Orthopedic Surgery | Admitting: Orthopedic Surgery

## 2018-03-20 ENCOUNTER — Ambulatory Visit (HOSPITAL_COMMUNITY): Payer: BC Managed Care – PPO | Admitting: Certified Registered Nurse Anesthetist

## 2018-03-20 ENCOUNTER — Ambulatory Visit (HOSPITAL_COMMUNITY)
Admission: RE | Disposition: A | Discharge status: Home / Self Care | Payer: Self-pay | Source: Ambulatory Visit | Attending: Orthopedic Surgery

## 2018-03-20 DIAGNOSIS — Z419 Encounter for procedure for purposes other than remedying health state, unspecified: Secondary | ICD-10-CM

## 2018-03-20 DIAGNOSIS — M79605 Pain in left leg: Secondary | ICD-10-CM | POA: Diagnosis present

## 2018-03-20 DIAGNOSIS — M5117 Intervertebral disc disorders with radiculopathy, lumbosacral region: Secondary | ICD-10-CM | POA: Diagnosis not present

## 2018-03-20 HISTORY — PX: LUMBAR LAMINECTOMY/DECOMPRESSION MICRODISCECTOMY: SHX5026

## 2018-03-20 HISTORY — DX: Nonspecific reaction to tuberculin skin test without active tuberculosis: R76.11

## 2018-03-20 HISTORY — DX: Gastro-esophageal reflux disease without esophagitis: K21.9

## 2018-03-20 LAB — CBC WITH DIFFERENTIAL/PLATELET
ABS IMMATURE GRANULOCYTES: 0 10*3/uL (ref 0.0–0.1)
Basophils Absolute: 0.1 10*3/uL (ref 0.0–0.1)
Basophils Relative: 1 %
Eosinophils Absolute: 0.3 10*3/uL (ref 0.0–0.7)
Eosinophils Relative: 5 %
HEMATOCRIT: 44.7 % (ref 39.0–52.0)
HEMOGLOBIN: 14.9 g/dL (ref 13.0–17.0)
IMMATURE GRANULOCYTES: 0 %
LYMPHS ABS: 1.6 10*3/uL (ref 0.7–4.0)
LYMPHS PCT: 28 %
MCH: 32 pg (ref 26.0–34.0)
MCHC: 33.3 g/dL (ref 30.0–36.0)
MCV: 96.1 fL (ref 78.0–100.0)
MONOS PCT: 8 %
Monocytes Absolute: 0.5 10*3/uL (ref 0.1–1.0)
Neutro Abs: 3.5 10*3/uL (ref 1.7–7.7)
Neutrophils Relative %: 58 %
Platelets: 319 10*3/uL (ref 150–400)
RBC: 4.65 MIL/uL (ref 4.22–5.81)
RDW: 12.3 % (ref 11.5–15.5)
WBC: 5.9 10*3/uL (ref 4.0–10.5)

## 2018-03-20 LAB — COMPREHENSIVE METABOLIC PANEL
ALBUMIN: 3.7 g/dL (ref 3.5–5.0)
ALK PHOS: 51 U/L (ref 38–126)
ALT: 27 U/L (ref 0–44)
AST: 17 U/L (ref 15–41)
Anion gap: 9 (ref 5–15)
BILIRUBIN TOTAL: 1 mg/dL (ref 0.3–1.2)
BUN: 14 mg/dL (ref 6–20)
CO2: 27 mmol/L (ref 22–32)
CREATININE: 1.44 mg/dL — AB (ref 0.61–1.24)
Calcium: 9.4 mg/dL (ref 8.9–10.3)
Chloride: 104 mmol/L (ref 98–111)
GFR calc Af Amer: >60 mL/min (ref >60)
GFR, EST NON AFRICAN AMERICAN: 54 mL/min — AB (ref >60)
GLUCOSE: 94 mg/dL (ref 70–99)
POTASSIUM: 4.3 mmol/L (ref 3.5–5.1)
Sodium: 140 mmol/L (ref 135–145)
TOTAL PROTEIN: 6.6 g/dL (ref 6.5–8.1)

## 2018-03-20 LAB — TYPE AND SCREEN
ABO/RH(D): O POS
ANTIBODY SCREEN: NEGATIVE

## 2018-03-20 LAB — ABO/RH: ABO/RH(D): O POS

## 2018-03-20 LAB — PROTIME-INR
INR: 0.95
Prothrombin Time: 12.6 seconds (ref 11.4–15.2)

## 2018-03-20 LAB — APTT: aPTT: 33 seconds (ref 24–36)

## 2018-03-20 SURGERY — LUMBAR LAMINECTOMY/DECOMPRESSION MICRODISCECTOMY
Anesthesia: General

## 2018-03-20 MED ORDER — DEXAMETHASONE SODIUM PHOSPHATE 10 MG/ML IJ SOLN
INTRAMUSCULAR | Status: AC
  Filled 2018-03-20: qty 1

## 2018-03-20 MED ORDER — DEXAMETHASONE SODIUM PHOSPHATE 10 MG/ML IJ SOLN
INTRAMUSCULAR | Status: DC | PRN
  Administered 2018-03-20: 10 mg via INTRAVENOUS

## 2018-03-20 MED ORDER — METHYLPREDNISOLONE ACETATE 40 MG/ML IJ SUSP
INTRAMUSCULAR | Status: AC
  Filled 2018-03-20: qty 1

## 2018-03-20 MED ORDER — PHENYLEPHRINE HCL 10 MG/ML IJ SOLN
INTRAMUSCULAR | Status: DC | PRN
  Administered 2018-03-20: 120 ug via INTRAVENOUS

## 2018-03-20 MED ORDER — ROCURONIUM BROMIDE 50 MG/5ML IV SOSY
PREFILLED_SYRINGE | INTRAVENOUS | Status: AC
  Filled 2018-03-20: qty 10

## 2018-03-20 MED ORDER — PROPOFOL 10 MG/ML IV BOLUS
INTRAVENOUS | Status: AC
  Filled 2018-03-20: qty 20

## 2018-03-20 MED ORDER — PROMETHAZINE HCL 25 MG/ML IJ SOLN: 6.2500 mg | INTRAMUSCULAR | Status: DC | PRN

## 2018-03-20 MED ORDER — CEFAZOLIN SODIUM-DEXTROSE 2-4 GM/100ML-% IV SOLN
INTRAVENOUS | Status: AC
  Filled 2018-03-20: qty 100

## 2018-03-20 MED ORDER — BUPIVACAINE-EPINEPHRINE (PF) 0.25% -1:200000 IJ SOLN
INTRAMUSCULAR | Status: AC
  Filled 2018-03-20: qty 30

## 2018-03-20 MED ORDER — BUPIVACAINE LIPOSOME 1.3 % IJ SUSP
20.0000 mL | INTRAMUSCULAR | Status: AC
  Administered 2018-03-20: 5 mL
  Filled 2018-03-20: qty 20

## 2018-03-20 MED ORDER — DIAZEPAM 5 MG PO TABS
ORAL_TABLET | ORAL | Status: AC
  Filled 2018-03-20: qty 1

## 2018-03-20 MED ORDER — ONDANSETRON HCL 4 MG/2ML IJ SOLN
INTRAMUSCULAR | Status: DC | PRN
  Administered 2018-03-20: 4 mg via INTRAVENOUS

## 2018-03-20 MED ORDER — DIAZEPAM 5 MG PO TABS
5.0000 mg | ORAL_TABLET | Freq: Once | ORAL | Status: AC
  Administered 2018-03-20: 5 mg via ORAL

## 2018-03-20 MED ORDER — LIDOCAINE 2% (20 MG/ML) 5 ML SYRINGE
INTRAMUSCULAR | Status: AC
  Filled 2018-03-20: qty 10

## 2018-03-20 MED ORDER — CEFAZOLIN SODIUM-DEXTROSE 2-4 GM/100ML-% IV SOLN
2.0000 g | INTRAVENOUS | Status: AC
  Administered 2018-03-20: 2 g via INTRAVENOUS
  Filled 2018-03-20: qty 100

## 2018-03-20 MED ORDER — BUPIVACAINE-EPINEPHRINE 0.25% -1:200000 IJ SOLN
INTRAMUSCULAR | Status: DC | PRN
  Administered 2018-03-20: 8 mL
  Administered 2018-03-20: 5 mL

## 2018-03-20 MED ORDER — LACTATED RINGERS IV SOLN
INTRAVENOUS | Status: DC
  Administered 2018-03-20: 17:00:00 via INTRAVENOUS
  Administered 2018-03-20: 50 mL/h via INTRAVENOUS

## 2018-03-20 MED ORDER — METHYLENE BLUE 0.5 % INJ SOLN
INTRAVENOUS | Status: AC
  Filled 2018-03-20: qty 10

## 2018-03-20 MED ORDER — THROMBIN (RECOMBINANT) 20000 UNITS EX SOLR
CUTANEOUS | Status: AC
  Filled 2018-03-20: qty 20000

## 2018-03-20 MED ORDER — MIDAZOLAM HCL 5 MG/5ML IJ SOLN
INTRAMUSCULAR | Status: DC | PRN
  Administered 2018-03-20: 2 mg via INTRAVENOUS

## 2018-03-20 MED ORDER — LIDOCAINE HCL (CARDIAC) PF 100 MG/5ML IV SOSY
PREFILLED_SYRINGE | INTRAVENOUS | Status: DC | PRN
  Administered 2018-03-20: 100 mg via INTRAVENOUS

## 2018-03-20 MED ORDER — HYDROCODONE-ACETAMINOPHEN 7.5-325 MG PO TABS
1.0000 | ORAL_TABLET | Freq: Once | ORAL | Status: AC | PRN
  Administered 2018-03-20: 1 via ORAL

## 2018-03-20 MED ORDER — HYDROMORPHONE HCL 1 MG/ML IJ SOLN
INTRAMUSCULAR | Status: AC
  Administered 2018-03-20: 0.5 mg via INTRAVENOUS
  Filled 2018-03-20: qty 1

## 2018-03-20 MED ORDER — ACETAMINOPHEN 500 MG PO TABS
1000.0000 mg | ORAL_TABLET | Freq: Once | ORAL | Status: AC
  Administered 2018-03-20: 1000 mg via ORAL
  Filled 2018-03-20: qty 2

## 2018-03-20 MED ORDER — 0.9 % SODIUM CHLORIDE (POUR BTL) OPTIME
TOPICAL | Status: DC | PRN
  Administered 2018-03-20: 1000 mL

## 2018-03-20 MED ORDER — MEPERIDINE HCL 50 MG/ML IJ SOLN: 6.2500 mg | INTRAMUSCULAR | Status: DC | PRN

## 2018-03-20 MED ORDER — FENTANYL CITRATE (PF) 250 MCG/5ML IJ SOLN
INTRAMUSCULAR | Status: AC
  Filled 2018-03-20: qty 5

## 2018-03-20 MED ORDER — CLONIDINE HCL 0.2 MG PO TABS
0.2000 mg | ORAL_TABLET | Freq: Once | ORAL | Status: AC
  Administered 2018-03-20: 0.2 mg via ORAL
  Filled 2018-03-20: qty 1

## 2018-03-20 MED ORDER — POVIDONE-IODINE 7.5 % EX SOLN
Freq: Once | CUTANEOUS | Status: DC
  Filled 2018-03-20: qty 118

## 2018-03-20 MED ORDER — PROPOFOL 10 MG/ML IV BOLUS
INTRAVENOUS | Status: DC | PRN
  Administered 2018-03-20: 200 mg via INTRAVENOUS

## 2018-03-20 MED ORDER — EPHEDRINE 5 MG/ML INJ
INTRAVENOUS | Status: AC
  Filled 2018-03-20: qty 10

## 2018-03-20 MED ORDER — HYDROCORTISONE NA SUCCINATE PF 250 MG IJ SOLR
INTRAMUSCULAR | Status: AC
  Filled 2018-03-20: qty 250

## 2018-03-20 MED ORDER — ROCURONIUM BROMIDE 10 MG/ML (PF) SYRINGE
PREFILLED_SYRINGE | INTRAVENOUS | Status: DC | PRN
  Administered 2018-03-20: 50 mg via INTRAVENOUS

## 2018-03-20 MED ORDER — HYDROMORPHONE HCL 1 MG/ML IJ SOLN
0.2500 mg | INTRAMUSCULAR | Status: DC | PRN
  Administered 2018-03-20: 0.5 mg via INTRAVENOUS

## 2018-03-20 MED ORDER — ONDANSETRON HCL 4 MG/2ML IJ SOLN
INTRAMUSCULAR | Status: AC
  Filled 2018-03-20: qty 2

## 2018-03-20 MED ORDER — DEXAMETHASONE SODIUM PHOSPHATE 10 MG/ML IJ SOLN
INTRAMUSCULAR | Status: AC
  Filled 2018-03-20: qty 2

## 2018-03-20 MED ORDER — ESMOLOL HCL 100 MG/10ML IV SOLN
INTRAVENOUS | Status: AC
  Filled 2018-03-20: qty 10

## 2018-03-20 MED ORDER — MIDAZOLAM HCL 2 MG/2ML IJ SOLN
INTRAMUSCULAR | Status: AC
  Filled 2018-03-20: qty 2

## 2018-03-20 MED ORDER — PROMETHAZINE HCL 25 MG/ML IJ SOLN
INTRAMUSCULAR | Status: AC
  Filled 2018-03-20: qty 1

## 2018-03-20 MED ORDER — FENTANYL CITRATE (PF) 100 MCG/2ML IJ SOLN
INTRAMUSCULAR | Status: DC | PRN
  Administered 2018-03-20: 50 ug via INTRAVENOUS
  Administered 2018-03-20 (×2): 100 ug via INTRAVENOUS
  Administered 2018-03-20: 50 ug via INTRAVENOUS
  Administered 2018-03-20: 100 ug via INTRAVENOUS
  Administered 2018-03-20: 50 ug via INTRAVENOUS

## 2018-03-20 MED ORDER — ARTIFICIAL TEARS OPHTHALMIC OINT
TOPICAL_OINTMENT | OPHTHALMIC | Status: AC
  Filled 2018-03-20: qty 10.5

## 2018-03-20 MED ORDER — KETOROLAC TROMETHAMINE 30 MG/ML IJ SOLN
INTRAMUSCULAR | Status: AC
  Filled 2018-03-20: qty 1

## 2018-03-20 MED ORDER — HYDROCODONE-ACETAMINOPHEN 7.5-325 MG PO TABS
ORAL_TABLET | ORAL | Status: AC
  Administered 2018-03-20: 1 via ORAL
  Filled 2018-03-20: qty 1

## 2018-03-20 MED ORDER — THROMBIN 20000 UNITS EX SOLR
CUTANEOUS | Status: DC | PRN
  Administered 2018-03-20: 20000 [IU] via TOPICAL

## 2018-03-20 MED ORDER — SUGAMMADEX SODIUM 200 MG/2ML IV SOLN
INTRAVENOUS | Status: DC | PRN
  Administered 2018-03-20: 170 mg via INTRAVENOUS

## 2018-03-20 MED ORDER — ACETAMINOPHEN 10 MG/ML IV SOLN: 1000.0000 mg | Freq: Once | INTRAVENOUS | Status: DC | PRN

## 2018-03-20 MED ORDER — METHYLPREDNISOLONE ACETATE 40 MG/ML IJ SUSP
INTRAMUSCULAR | Status: DC | PRN
  Administered 2018-03-20: 40 mg

## 2018-03-20 MED ORDER — PHENYLEPHRINE 40 MCG/ML (10ML) SYRINGE FOR IV PUSH (FOR BLOOD PRESSURE SUPPORT)
PREFILLED_SYRINGE | INTRAVENOUS | Status: AC
  Filled 2018-03-20: qty 10

## 2018-03-20 MED ORDER — THROMBIN 20000 UNITS EX SOLR
CUTANEOUS | Status: DC | PRN
  Administered 2018-03-20: 16:00:00 via TOPICAL

## 2018-03-20 MED ORDER — CEFAZOLIN SODIUM 1 G IJ SOLR
INTRAMUSCULAR | Status: AC
  Filled 2018-03-20: qty 20

## 2018-03-20 MED ORDER — GLYCOPYRROLATE PF 0.2 MG/ML IJ SOSY
PREFILLED_SYRINGE | INTRAMUSCULAR | Status: AC
  Filled 2018-03-20: qty 2

## 2018-03-20 MED ORDER — DIPHENHYDRAMINE HCL 50 MG/ML IJ SOLN
INTRAMUSCULAR | Status: AC
  Filled 2018-03-20: qty 2

## 2018-03-20 MED ORDER — HEMOSTATIC AGENTS (NO CHARGE) OPTIME
TOPICAL | Status: DC | PRN
  Administered 2018-03-20: 1 via TOPICAL

## 2018-03-20 SURGICAL SUPPLY — 73 items
APL SKNCLS STERI-STRIP NONHPOA (GAUZE/BANDAGES/DRESSINGS)
BENZOIN TINCTURE PRP APPL 2/3 (GAUZE/BANDAGES/DRESSINGS) IMPLANT
BUR PRECISION FLUTE 5.0 (BURR) ×3 IMPLANT
CABLE BIPOLOR RESECTION CORD (MISCELLANEOUS) ×3 IMPLANT
CANISTER SUCT 3000ML PPV (MISCELLANEOUS) ×3 IMPLANT
CARTRIDGE OIL MAESTRO DRILL (MISCELLANEOUS) ×1 IMPLANT
CLOSURE WOUND 1/2 X4 (GAUZE/BANDAGES/DRESSINGS)
COVER SURGICAL LIGHT HANDLE (MISCELLANEOUS) ×3 IMPLANT
DIFFUSER DRILL AIR PNEUMATIC (MISCELLANEOUS) ×3 IMPLANT
DRAIN CHANNEL 15F RND FF W/TCR (WOUND CARE) IMPLANT
DRAPE POUCH INSTRU U-SHP 10X18 (DRAPES) ×6 IMPLANT
DRAPE SURG 17X23 STRL (DRAPES) ×12 IMPLANT
DURAPREP 26ML APPLICATOR (WOUND CARE) ×3 IMPLANT
ELECT BLADE 4.0 EZ CLEAN MEGAD (MISCELLANEOUS) ×3
ELECT CAUTERY BLADE 6.4 (BLADE) ×3 IMPLANT
ELECT REM PT RETURN 9FT ADLT (ELECTROSURGICAL) ×3
ELECTRODE BLDE 4.0 EZ CLN MEGD (MISCELLANEOUS) ×1 IMPLANT
ELECTRODE REM PT RTRN 9FT ADLT (ELECTROSURGICAL) ×1 IMPLANT
EVACUATOR SILICONE 100CC (DRAIN) IMPLANT
FILTER STRAW FLUID ASPIR (MISCELLANEOUS) ×3 IMPLANT
FLOSEAL 10ML (HEMOSTASIS) ×2 IMPLANT
GAUZE 4X4 16PLY RFD (DISPOSABLE) ×6 IMPLANT
GAUZE SPONGE 4X4 12PLY STRL (GAUZE/BANDAGES/DRESSINGS) ×3 IMPLANT
GLOVE BIO SURGEON STRL SZ7 (GLOVE) ×3 IMPLANT
GLOVE BIO SURGEON STRL SZ8 (GLOVE) ×3 IMPLANT
GLOVE BIOGEL PI IND STRL 7.0 (GLOVE) ×1 IMPLANT
GLOVE BIOGEL PI IND STRL 8 (GLOVE) ×1 IMPLANT
GLOVE BIOGEL PI INDICATOR 7.0 (GLOVE) ×2
GLOVE BIOGEL PI INDICATOR 8 (GLOVE) ×2
GOWN STRL REUS W/ TWL LRG LVL3 (GOWN DISPOSABLE) ×1 IMPLANT
GOWN STRL REUS W/ TWL XL LVL3 (GOWN DISPOSABLE) ×2 IMPLANT
GOWN STRL REUS W/TWL LRG LVL3 (GOWN DISPOSABLE) ×3
GOWN STRL REUS W/TWL XL LVL3 (GOWN DISPOSABLE) ×6
IV CATH 14GX2 1/4 (CATHETERS) ×3 IMPLANT
KIT BASIN OR (CUSTOM PROCEDURE TRAY) ×3 IMPLANT
KIT POSITION SURG JACKSON T1 (MISCELLANEOUS) ×3 IMPLANT
KIT TURNOVER KIT B (KITS) ×3 IMPLANT
NDL 18GX1X1/2 (RX/OR ONLY) (NEEDLE) ×1 IMPLANT
NDL HYPO 25GX1X1/2 BEV (NEEDLE) ×1 IMPLANT
NDL SPNL 18GX3.5 QUINCKE PK (NEEDLE) ×2 IMPLANT
NEEDLE 18GX1X1/2 (RX/OR ONLY) (NEEDLE) ×3 IMPLANT
NEEDLE 22X1 1/2 (OR ONLY) (NEEDLE) ×3 IMPLANT
NEEDLE HYPO 25GX1X1/2 BEV (NEEDLE) ×3 IMPLANT
NEEDLE SPNL 18GX3.5 QUINCKE PK (NEEDLE) ×6 IMPLANT
NS IRRIG 1000ML POUR BTL (IV SOLUTION) ×3 IMPLANT
OIL CARTRIDGE MAESTRO DRILL (MISCELLANEOUS) ×3
PACK LAMINECTOMY ORTHO (CUSTOM PROCEDURE TRAY) ×3 IMPLANT
PACK UNIVERSAL I (CUSTOM PROCEDURE TRAY) ×3 IMPLANT
PAD ARMBOARD 7.5X6 YLW CONV (MISCELLANEOUS) ×6 IMPLANT
PATTIES SURGICAL .5 X.5 (GAUZE/BANDAGES/DRESSINGS) IMPLANT
PATTIES SURGICAL .5 X1 (DISPOSABLE) ×3 IMPLANT
SPONGE INTESTINAL PEANUT (DISPOSABLE) ×3 IMPLANT
SPONGE SURGIFOAM ABS GEL 100 (HEMOSTASIS) ×3 IMPLANT
SPONGE SURGIFOAM ABS GEL SZ50 (HEMOSTASIS) ×3 IMPLANT
STRIP CLOSURE SKIN 1/2X4 (GAUZE/BANDAGES/DRESSINGS) IMPLANT
SURGIFLO W/THROMBIN 8M KIT (HEMOSTASIS) IMPLANT
SUT MNCRL AB 4-0 PS2 18 (SUTURE) ×3 IMPLANT
SUT VIC AB 0 CT1 18XCR BRD 8 (SUTURE) IMPLANT
SUT VIC AB 0 CT1 27 (SUTURE)
SUT VIC AB 0 CT1 27XBRD ANBCTR (SUTURE) IMPLANT
SUT VIC AB 0 CT1 8-18 (SUTURE)
SUT VIC AB 1 CT1 18XCR BRD 8 (SUTURE) ×1 IMPLANT
SUT VIC AB 1 CT1 8-18 (SUTURE) ×3
SUT VIC AB 2-0 CT2 18 VCP726D (SUTURE) ×3 IMPLANT
SYR 20CC LL (SYRINGE) ×3 IMPLANT
SYR BULB IRRIGATION 50ML (SYRINGE) ×3 IMPLANT
SYR CONTROL 10ML LL (SYRINGE) ×6 IMPLANT
SYR TB 1ML 26GX3/8 SAFETY (SYRINGE) ×6 IMPLANT
SYR TB 1ML LUER SLIP (SYRINGE) ×6 IMPLANT
TOWEL GREEN STERILE (TOWEL DISPOSABLE) ×3 IMPLANT
TOWEL GREEN STERILE FF (TOWEL DISPOSABLE) ×3 IMPLANT
WATER STERILE IRR 1000ML POUR (IV SOLUTION) ×3 IMPLANT
YANKAUER SUCT BULB TIP NO VENT (SUCTIONS) ×3 IMPLANT

## 2018-03-20 NOTE — Anesthesia Preprocedure Evaluation (Signed)
Anesthesia Evaluation  Patient identified by MRN, date of birth, ID band Patient awake    Reviewed: Allergy & Precautions, NPO status , Patient's Chart, lab work & pertinent test results  Airway Mallampati: II  TM Distance: >3 FB Neck ROM: Full    Dental no notable dental hx. (+) Teeth Intact, Dental Advisory Given   Pulmonary neg pulmonary ROS,    Pulmonary exam normal breath sounds clear to auscultation       Cardiovascular Exercise Tolerance: Good negative cardio ROS Normal cardiovascular exam Rhythm:Regular Rate:Normal     Neuro/Psych LLE Weakness foot negative neurological ROS     GI/Hepatic Neg liver ROS, GERD  ,  Endo/Other    Renal/GU      Musculoskeletal negative musculoskeletal ROS (+)   Abdominal   Peds  Hematology negative hematology ROS (+)   Anesthesia Other Findings   Reproductive/Obstetrics                            Lab Results  Component Value Date   WBC 5.9 03/20/2018   HGB 14.9 03/20/2018   HCT 44.7 03/20/2018   MCV 96.1 03/20/2018   PLT 319 03/20/2018    Anesthesia Physical Anesthesia Plan  ASA: II  Anesthesia Plan: General   Post-op Pain Management:    Induction: Intravenous  PONV Risk Score and Plan: 2 and Treatment may vary due to age or medical condition, Ondansetron and Dexamethasone  Airway Management Planned: Oral ETT  Additional Equipment:   Intra-op Plan:   Post-operative Plan: Extubation in OR  Informed Consent: I have reviewed the patients History and Physical, chart, labs and discussed the procedure including the risks, benefits and alternatives for the proposed anesthesia with the patient or authorized representative who has indicated his/her understanding and acceptance.   Dental advisory given  Plan Discussed with:   Anesthesia Plan Comments:         Anesthesia Quick Evaluation

## 2018-03-20 NOTE — Transfer of Care (Signed)
Immediate Anesthesia Transfer of Care Note  Patient: Dillon Reeves  Procedure(s) Performed: LEFT LUMBAR 5- SACRUM 1 MICRODISECTOMY (N/A )  Patient Location: PACU  Anesthesia Type:General  Level of Consciousness: sedated and patient cooperative  Airway & Oxygen Therapy: Patient Spontanous Breathing  Post-op Assessment: Report given to RN and Post -op Vital signs reviewed and stable  Post vital signs: Reviewed and stable  Last Vitals:  Vitals Value Taken Time  BP 141/84 03/20/2018  5:48 PM  Temp    Pulse 91 03/20/2018  5:48 PM  Resp 7 03/20/2018  5:48 PM  SpO2 97 % 03/20/2018  5:48 PM  Vitals shown include unvalidated device data.  Last Pain:  Vitals:   03/20/18 1250  TempSrc:   PainSc: 3       Patients Stated Pain Goal: 2 (00/94/17 9199)  Complications: No apparent anesthesia complications

## 2018-03-20 NOTE — Op Note (Signed)
NAME:  Dillon Reeves          MEDICAL RECORD NO.:  [id number]   PHYSICIAN:  Phylliss Bob, MD      DATE OF BIRTH:  [birth year]   DATE OF PROCEDURE:  03/20/2018                               OPERATIVE REPORT     PREOPERATIVE DIAGNOSES: 1. Left-sided S1 radiculopathy. 2. 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   POSTOPERATIVE DIAGNOSES: 1. Left-sided S1 radiculopathy. 2. 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   PROCEDURES:  Left-sided L5-S1 laminotomy with partial facetectomy and removal of large herniated left-sided L5-S1 disk fragment.   SURGEON:  Phylliss Bob, MD.   ASSISTANT:  None   ANESTHESIA:  General endotracheal anesthesia.   COMPLICATIONS:  None.   DISPOSITION:  Stable.   ESTIMATED BLOOD LOSS:  Minimal.   INDICATIONS FOR SURGERY:  Briefly, Dillon Reeves is a pleasant 57 year old male, who did present to me with ongoing severe pain in the left leg.  The patient's MRI did reveal the findings outlined above, clearly notable for a large herniated disk fragment.  Of note, an MRI from about 1 year prior did reveal a similar sized disc herniation. He's had ongoing pain for years, which substantially increased about 2 months ago. The pain was rather severe.  We did discuss treatment options and we did ultimately elect to proceed with the procedure reflected above.  The patient was fully made aware of the risks of surgery, including the risk of recurrent herniation and the need for subsequent surgery, including the possibility of a subsequent diskectomy and/or fusion.   OPERATIVE DETAILS:  On 03/20/2018, the patient was brought to surgery and general endotracheal anesthesia was administered.  The patient was placed prone on a well-padded flat Jackson bed with a spinal frame.  Antibiotics were given.  The back was prepped and draped and  a time-out procedure was performed.  At this point, a midline incision was made directly over the L5-S1 intervertebral space.  A curvilinear incision was made just to the left of the midline into the fascia.  A self-retaining McCulloch retractor was placed.  The lamina of L5 and S1 was identified and subperiosteally exposed.  I then removed the lateral aspect of the L5-S1 ligamentum flavum.  Readily identified was the traversing left S1 nerve, which was noted to be under very obvious tension, and it was noted to be rather erythematous.  I was able to gently gain medial retraction of the nerve, and in doing so, a very large herniated disk fragment was readily noted.  This was removed in multiple fragments.  Additional disc fragments were noted in the intervertebral space and were uneventfully removed. At this point, the left S1 nerve was evaluated, and was noted to be free of tension.   I was very pleased with the final decompression that I was able to accomplish.  At this point, the wound was copiously irrigated with normal saline.  All epidural bleeding was controlled using bipolar electrocautery in addition to Surgiflo. All bleeding was controlled at the termination of the procedure.  At this point, 20 mg of Depo-Medrol was introduced about the epidural space in the region of the left S1 nerve.  The wound was then closed in  layers using #1 Vicryl followed by 2-0 Vicryl, followed by 4-0 Monocryl. Benzoin and Steri-Strips were applied followed by a sterile dressing. All instrument counts were correct at the termination of the procedure.     Phylliss Bob, MD

## 2018-03-20 NOTE — Anesthesia Procedure Notes (Signed)
Procedure Name: Intubation Date/Time: 03/20/2018 3:40 PM Performed by: Moshe Salisbury, CRNA Pre-anesthesia Checklist: Patient identified, Emergency Drugs available, Suction available and Patient being monitored Patient Re-evaluated:Patient Re-evaluated prior to induction Oxygen Delivery Method: Circle System Utilized Preoxygenation: Pre-oxygenation with 100% oxygen Induction Type: IV induction Ventilation: Mask ventilation without difficulty Laryngoscope Size: Mac and 4 Grade View: Grade III Tube type: Oral Tube size: 7.5 mm Number of attempts: 1 Airway Equipment and Method: Stylet Placement Confirmation: ETT inserted through vocal cords under direct vision,  positive ETCO2 and breath sounds checked- equal and bilateral Secured at: 21 cm Tube secured with: Tape Dental Injury: Teeth and Oropharynx as per pre-operative assessment

## 2018-03-20 NOTE — H&P (Signed)
PREOPERATIVE H&P  Chief Complaint: Left leg pain  HPI: Dillon Reeves is a 57 y.o. 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 Allergy   . Anxiety   . GERD (gastroesophageal reflux disease)    takes Zantac prn  . Metacarpal bone fracture    5th finger  . 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 OPEN REDUCTION INTERNAL FIXATION (ORIF) HAND Right 01/29/2015   Procedure: OPEN TREATMENT RIGHT FIFTH METACARPAL FRACTURE;  Surgeon: Milly Jakob, MD;  Location: [facility];  Service: Orthopedics;  Laterality: Right;   Social History   Socioeconomic History  . Marital status: Married    Spouse name: separated  . Number of children: 3  . Years of education: 2+ years college  . Highest education level: Not on file  Occupational History  . Occupation: delivery truck Education administrator: [employer]  . Occupation: bus Education administrator: [place]  . Financial resource strain: Not on file  . Food insecurity:    Worry: Not on file    Inability: Not on file  . Transportation needs:    Medical: Not on file    Non-medical: Not on file  Tobacco Use  . Smoking status: Never Smoker  . Smokeless tobacco: Never Used  Substance and Sexual Activity  . Alcohol use: Yes    Comment: once or twice every two weeks varies  . Drug use: No  . Sexual activity: Yes  Lifestyle  . Physical activity:    Days per week: Not on file    Minutes per session: Not on file  . Stress: Not on file  Relationships  . Social connections:    Talks on phone: Not on file    Gets together: Not on file    Attends  religious service: Not on file    Active member of club or organization: Not on file    Attends meetings of clubs or organizations: Not on file    Relationship status: Not on file  Other Topics Concern  . Not on file  Social History Narrative   Lives with his wife and their children.   Separating from wife (08/2016).   Family History  Problem Relation Age of Onset  . Heart Problems Mother   . Anemia Mother   . Hypertension Brother    Allergies  Allergen Reactions  . Fish-Derived Products Anaphylaxis    All [employer].  . Other Anaphylaxis   Prior to Admission medications   Medication Sig Start Date End Date Taking? Authorizing Provider  cyclobenzaprine (FLEXERIL) 5 MG tablet Take 1 tablet (5 mg total) by mouth 3 (three) times daily as needed for muscle spasms (start qhs prn due to sedation). 02/25/18  Yes Wendie Agreste, MD  HYDROmorphone (DILAUDID) 2 MG tablet Take 2 mg by mouth at bedtime as needed for pain. 03/16/18  Yes [provider]  ibuprofen (ADVIL,MOTRIN) 200 MG tablet Take 400 mg by mouth every 6 (six) hours as needed for moderate pain.    Yes [provider]  trolamine salicylate (ASPERCREME) 10 %  cream Apply 1 application topically daily as needed for muscle pain.   Yes [provider]  HYDROcodone-acetaminophen (NORCO/VICODIN) 5-325 MG tablet Take 1-2 tablets by mouth every 6 (six) hours as needed for moderate pain. Patient not taking: Reported on 03/19/2018 03/02/18   Wendie Agreste, MD  oxyCODONE-acetaminophen (PERCOCET) 5-325 MG tablet Take 1-2 tablets by mouth every 6 (six) hours as needed for severe pain. Patient not taking: Reported on 03/19/2018 03/05/18   Wendie Agreste, MD  predniSONE (DELTASONE) 20 MG tablet 3 by mouth for 3 days, then 2 by mouth for 2 days, then 1 by mouth for 2 days, then 1/2 by mouth for 2 days. Patient not taking: Reported on 03/19/2018 02/25/18   Wendie Agreste, MD     All other systems have been reviewed  and were otherwise negative with the exception of those mentioned in the HPI and as above.  Physical Exam: Vitals:   03/20/18 1147  BP: (!) 166/103  Pulse: 85  Resp: 18  Temp: 98.1 F (36.7 C)  SpO2: 100%    Body mass index is 24.97 kg/m.  General: Alert, no acute distress Cardiovascular: No pedal edema Respiratory: No cyanosis, no use of accessory musculature Skin: No lesions in the area of chief complaint Neurologic: Sensation intact distally Psychiatric: Patient is competent for consent with normal mood and affect Lymphatic: No axillary or cervical lymphadenopathy  MUSCULOSKELETAL: + SLR on the left  Assessment/Plan: PAIN IN THE LEFT LEG SECONDARY TO LARGE LEFT L5-S1 [place] for Procedure(s): LEFT LUMBAR 5- SACRUM 1 MICRODISECTOMY   Sinclair Ship, MD 03/20/2018 2:36 PM

## 2018-03-21 ENCOUNTER — Encounter (HOSPITAL_COMMUNITY): Payer: Self-pay | Admitting: Orthopedic Surgery

## 2018-03-21 MED FILL — Thrombin (Recombinant) For Soln 20000 Unit: CUTANEOUS | Qty: 1 | Status: AC

## 2018-03-21 NOTE — Anesthesia Postprocedure Evaluation (Signed)
Anesthesia Post Note  Patient: Dillon Reeves  Procedure(s) Performed: LEFT LUMBAR 5- SACRUM 1 MICRODISECTOMY (N/A )     Patient location during evaluation: PACU Anesthesia Type: General Level of consciousness: awake and alert Pain management: pain level controlled Vital Signs Assessment: post-procedure vital signs reviewed and stable Respiratory status: spontaneous breathing, nonlabored ventilation, respiratory function stable and patient connected to nasal cannula oxygen Cardiovascular status: blood pressure returned to baseline and stable Postop Assessment: no apparent nausea or vomiting Anesthetic complications: no    Last Vitals:  Vitals:   03/20/18 1850 03/20/18 1905  BP: 131/84 (!) 131/97  Pulse: 92 88  Resp: 18 13  Temp:  (!) 36.4 C  SpO2: 100% 98%    Last Pain:  Vitals:   03/20/18 1905  TempSrc:   PainSc: 7    Pain Goal: Patients Stated Pain Goal: 3 (03/20/18 1905)               [name]

## 2019-03-28 ENCOUNTER — Encounter: Payer: Self-pay | Admitting: Family Medicine

## 2019-03-28 ENCOUNTER — Ambulatory Visit: Payer: BC Managed Care – PPO | Admitting: Family Medicine

## 2019-03-28 ENCOUNTER — Other Ambulatory Visit: Payer: Self-pay

## 2019-03-28 ENCOUNTER — Encounter: Payer: Self-pay | Admitting: Gastroenterology

## 2019-03-28 VITALS — BP 140/89 | HR 79 | Temp 98.4°F | Wt 196.0 lb

## 2019-03-28 DIAGNOSIS — R030 Elevated blood-pressure reading, without diagnosis of hypertension: Secondary | ICD-10-CM

## 2019-03-28 DIAGNOSIS — K2100 Gastro-esophageal reflux disease with esophagitis, without bleeding: Secondary | ICD-10-CM

## 2019-03-28 DIAGNOSIS — Z23 Encounter for immunization: Secondary | ICD-10-CM | POA: Diagnosis not present

## 2019-03-28 DIAGNOSIS — R0789 Other chest pain: Secondary | ICD-10-CM

## 2019-03-28 DIAGNOSIS — R9431 Abnormal electrocardiogram [ECG] [EKG]: Secondary | ICD-10-CM

## 2019-03-28 DIAGNOSIS — K220 Achalasia of cardia: Secondary | ICD-10-CM

## 2019-03-28 DIAGNOSIS — R112 Nausea with vomiting, unspecified: Secondary | ICD-10-CM

## 2019-03-28 DIAGNOSIS — F439 Reaction to severe stress, unspecified: Secondary | ICD-10-CM

## 2019-03-28 DIAGNOSIS — F411 Generalized anxiety disorder: Secondary | ICD-10-CM

## 2019-03-28 MED ORDER — OMEPRAZOLE 20 MG PO CPDR: 20.0000 mg | DELAYED_RELEASE_CAPSULE | Freq: Every day | ORAL | 1 refills | Status: DC

## 2019-03-28 NOTE — Progress Notes (Signed)
Subjective:    Patient ID: Dillon Reeves, male    DOB: 02-05-1961, 58 y.o.   MRN: [id number]  HPI Dillon Reeves is a 58 y.o. male Presents today for: Chief Complaint  Patient presents with  . Heartburn    Been having problem acid reflux. Even when drink or eating feel like the food want go down. center of chest. Bloated and gas but can not pass gas . took otc meds but they are not helping. This has been going on 4-5 month but last 3-4 weeks worse. begging to vomit a little more.  . chronic medical condition    here today to check bp and anxiety. Bp has been running high for a while. GAD7=6   GERD: History of GERD, treated with famotidine in past.  Worsening past 4 months. Even with meds recently, persistent symptoms.  More frequent difficulty with passing liquid or food at times. Vomits up acid at times - 3-5 times past few weeks.  Liquid only, no solid food. No hematemesis., no coffee ground emesis.  No hemotochezia or melena.  No wt loss, night sweats, fever.  Alcohol - few drinks on weekends.  No tobacco.  Feels like food stuck in center of chest after eating. No other chest pain/pressure or radiation.   Tx: otc prilosec, alka seltzer. Taking meds daily.    Elevated blood pressure:  BP Readings from Last 3 Encounters:  03/28/19 140/89  03/20/18 (!) 131/97  03/05/18 129/85  no current meds.  Few drinks on weekends.  No cocaine or other IDU.  No added salt, but eats fast food.  nsaids regular  Elevated 9 months ago for a physical. Some white coat htn possible, anxious at times of a physical. No outside BP's.   Eyes: Negative for visual disturbance.  Respiratory: Negative for cough, chest tightness and shortness of breath.   Cardiovascular: Negative for chest pain, palpitations and leg swelling.  Gastrointestinal: Negative for abdominal pain and blood in stool.  Neurological: Negative for dizziness, light-headedness and headaches. no focal weakness.      Anxiety:  Depression screen [facility] 2/9 03/28/2019 03/05/2018 02/25/2018 10/30/2016 09/28/2016  Decreased Interest 0 0 0 0 0  Down, Depressed, Hopeless 0 0 0 0 0  PHQ - 2 Score 0 0 0 0 0  some anxiety with work, stress with customers at times. Some family members have passed away and friends have been sick.  Less exercise with work hours recently, but trying to increase exercise - has helped with stress in past.  decreased sleep with prior schedule - will be improving.  advil pm helps some.  Declines therapy at this time.     Patient Active Problem List   Diagnosis Date Noted  . Acute left-sided low back pain with left-sided sciatica 10/30/2016  . 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 Allergy   . Anxiety   . GERD (gastroesophageal reflux disease)    takes Zantac prn  . Metacarpal bone fracture    5th finger  . 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 LUMBAR LAMINECTOMY/DECOMPRESSION MICRODISCECTOMY N/A 03/20/2018   Procedure: LEFT LUMBAR 5- SACRUM 1 MICRODISECTOMY;  Surgeon: Phylliss Bob, MD;  Location: [facility];  Service: Orthopedics;  Laterality: N/A;  . OPEN REDUCTION INTERNAL FIXATION (ORIF) HAND Right 01/29/2015   Procedure: OPEN TREATMENT RIGHT FIFTH METACARPAL FRACTURE;  Surgeon: Milly Jakob, MD;  Location: [facility];  Service: Orthopedics;  Laterality: Right;   Allergies  Allergen Reactions  . Fish-Derived Products Anaphylaxis    All [employer].  . Other Anaphylaxis  . Peanut-Containing Drug Products Anaphylaxis   Prior to Admission medications   Medication Sig Start Date End Date Taking? Authorizing Provider  Famotidine-Ca Carb-Mag Hydrox (ACID REDUCER + ANTACID PO) Take by mouth.   Yes [provider]  Multiple Vitamin (MULTIVITAMIN) tablet Take 1 tablet by mouth daily.   Yes [provider]   Social History   Socioeconomic History  . Marital status: Married     Spouse name: separated  . Number of children: 3  . Years of education: 2+ years college  . Highest education level: Not on file  Occupational History  . Occupation: delivery truck Education administrator: [employer]  . Occupation: bus Education administrator: [place]  . Financial resource strain: Not on file  . Food insecurity    Worry: Not on file    Inability: Not on file  . Transportation needs    Medical: Not on file    Non-medical: Not on file  Tobacco Use  . Smoking status: Never Smoker  . Smokeless tobacco: Never Used  Substance and Sexual Activity  . Alcohol use: Yes    Comment: once or twice every two weeks varies  . Drug use: No  . Sexual activity: Yes  Lifestyle  . Physical activity    Days per week: Not on file    Minutes per session: Not on file  . Stress: Not on file  Relationships  . Social Herbalist on phone: Not on file    Gets together: Not on file    Attends religious service: Not on file    Active member of club or organization: Not on file    Attends meetings of clubs or organizations: Not on file    Relationship status: Not on file  . Intimate partner violence    Fear of current or ex partner: Not on file    Emotionally abused: Not on file    Physically abused: Not on file    Forced sexual activity: Not on file  Other Topics Concern  . Not on file  Social History Narrative   Lives with his wife and their children.   Separating from wife (08/2016).    Review of Systems Per HPI.     Objective:   Physical Exam Vitals signs reviewed.  Constitutional:      Appearance: He is well-developed.  HENT:     Head: Normocephalic and atraumatic.  Eyes:     Pupils: Pupils are equal, round, and reactive to light.  Neck:     Vascular: No carotid bruit or JVD.  Cardiovascular:     Rate and Rhythm: Normal rate and regular rhythm.     Heart sounds: Normal heart sounds. No murmur.  Pulmonary:     Effort: Pulmonary  effort is normal.     Breath sounds: Normal breath sounds. No stridor. No wheezing or rales.  Abdominal:     General: Abdomen is flat. There is no distension.     Tenderness: There is no abdominal tenderness. There is no guarding or rebound.  Skin:    General: Skin is warm and dry.  Neurological:     Mental Status: He is alert and oriented to person, place, and time.    Vitals:   03/28/19 1026  03/28/19 1033  BP: (!) 144/94 140/89  Pulse: 79   Temp: 98.4 F (36.9 C)   TempSrc: Oral   SpO2: 99%   Weight: 196 lb (88.9 kg)     EKG: Sinus rhythm, rate 69.  T wave inversion II, III AVF, V4-V5. No prior ekg available for review.       Assessment & Plan:    Dillon Reeves is a 58 y.o. male GERD with esophagitis - Plan: Ambulatory referral to Gastroenterology, omeprazole (PRILOSEC) 20 MG capsule Achalasia - Plan: Ambulatory referral to Gastroenterology Non-intractable vomiting with nausea, unspecified vomiting type - Plan: CBC, Comprehensive metabolic panel, Lipase Chest discomfort - Plan: EKG 12-Lead, Ambulatory referral to Cardiology  - suspect uncontrolled GERD, with likely esophagitis, possible achalasia  -Start omeprazole twice daily, urgent referral placed to gastroenterology with ER precautions.  Labs pending above.  Need for prophylactic vaccination and inoculation against influenza - Plan: Flu Vaccine QUAD 6+ mos PF IM (Fluarix Quad PF)  Elevated blood pressure reading - Plan: EKG 12-Lead  -Borderline.  Cut back on alcohol, sodium with decrease fast foods, outside monitoring discussed, close follow-up ER precautions  Situational stress Anxiety state  -Handout given on coping techniques, follow-up next few weeks.  Nonspecific abnormal electrocardiogram (ECG) (EKG) - Plan: Ambulatory referral to Cardiology  -Chest symptoms above more likely GERD than true cardiac symptoms but ER/911 precautions given.  Avoid exertion at this time until evaluated by cardiology,  referral placed as abnormal EKG without prior EKG available for comparison.  Meds ordered this encounter  Medications  . omeprazole (PRILOSEC) 20 MG capsule    Sig: Take 1 capsule (20 mg total) by mouth daily.    Dispense:  30 capsule    Refill:  1   Patient Instructions  Omeprazole twice per day. Avoid alcohol and other heartburn triggers below. I will refer you to stomach specialist. Return to the clinic or go to the nearest emergency room if any of your symptoms worsen or new symptoms occur.  Keep a record of your blood pressures outside of the office and bring them to the next office visit. Cut back on alcohol, fast food for now, drink plenty of fluids. Recheck in 2 weeks. Try to avoid nsaids if possible - tylenol is safer.   See information below on stress and stress management.  Avoid exercise for now until you are evaluated by cardiology as there were some possible abnormalities on your EKG.  If any new or worsening chest symptoms, be seen in the emergency room.  Melatonin if needed to help sleep. recheck in 2 weeks.   Return to the clinic or go to the nearest emergency room if any of your symptoms worsen or new symptoms occur.   Stress Stress is a normal reaction to life events. Stress is what you feel when life demands more than you are used to, or more than you think you can handle. Some stress can be useful, such as studying for a test or meeting a deadline at work. Stress that occurs too often or for too long can cause problems. It can affect your emotional health and interfere with relationships and normal daily activities. Too much stress can weaken your body's defense system (immune system) and increase your risk for physical illness. If you already have a medical problem, stress can make it worse. What are the causes? All sorts of life events can cause stress. An event that causes stress for one person may not be stressful for another  person. Major life events, whether  positive or negative, commonly cause stress. Examples include:  Losing a job or starting a new job.  Losing a loved one.  Moving to a new town or home.  Getting married or divorced.  Having a baby.  Injury or illness. Less obvious life events can also cause stress, especially if they occur day after day or in combination with each other. Examples include:  Working long hours.  Driving in traffic.  Caring for children.  Being in debt.  Being in a difficult relationship. What are the signs or symptoms? Stress can cause emotional symptoms, including:  Anxiety. This is feeling worried, afraid, on edge, overwhelmed, or out of control.  Anger, including irritation or impatience.  Depression. This is feeling sad, down, helpless, or guilty.  Trouble focusing, remembering, or making decisions. Stress can cause physical symptoms, including:  Aches and pains. These may affect your head, neck, back, stomach, or other areas of your body.  Tight muscles or a clenched jaw.  Low energy.  Trouble sleeping. Stress can cause unhealthy behaviors, including:  Eating to feel better (overeating) or skipping meals.  Working too much or putting off tasks.  Smoking, drinking alcohol, or using drugs to feel better. How is this diagnosed? Stress is diagnosed through an assessment by your health care provider. He or she may diagnose this condition based on:  Your symptoms and any stressful life events.  Your medical history.  Tests to rule out other causes of your symptoms. Depending on your condition, your health care provider may refer you to a specialist for further evaluation. How is this treated?  Stress management techniques are the recommended treatment for stress. Medicine is not typically recommended for the treatment of stress. Techniques to reduce your reaction to stressful life events include:  Stress identification. Monitor yourself for symptoms of stress and identify  what causes stress for you. These skills may help you to avoid or prepare for stressful events.  Time management. Set your priorities, keep a calendar of events, and learn to say "no." Taking these actions can help you avoid making too many commitments. Techniques for coping with stress include:  Rethinking the problem. Try to think realistically about stressful events rather than ignoring them or overreacting. Try to find the positives in a stressful situation rather than focusing on the negatives.  Exercise. Physical exercise can release both physical and emotional tension. The key is to find a form of exercise that you enjoy and do it regularly.  Relaxation techniques. These relax the body and mind. The key is to find one or more that you enjoy and use the technique(s) regularly. Examples include: ? Meditation, deep breathing, or progressive relaxation techniques. ? Yoga or tai chi. ? Biofeedback, mindfulness techniques, or journaling. ? Listening to music, being out in nature, or participating in other hobbies.  Practicing a healthy lifestyle. Eat a balanced diet, drink plenty of water, limit or avoid caffeine, and get plenty of sleep.  Having a strong support network. Spend time with family, friends, or other people you enjoy being around. Express your feelings and talk things over with someone you trust. Counseling or talk therapy with a mental health professional may be helpful if you are having trouble managing stress on your own. Follow these instructions at home: Lifestyle   Avoid drugs.  Do not use any products that contain nicotine or tobacco, such as cigarettes and e-cigarettes. If you need help quitting, ask your health care provider.  Limit alcohol intake to no more than 1 drink a day for nonpregnant women and 2 drinks a day for men. One drink equals 12 oz of beer, 5 oz of wine, or 1 oz of hard liquor.  Do not use alcohol or drugs to relax.  Eat a balanced diet that  includes fresh fruits and vegetables, whole grains, lean meats, fish, eggs, and beans, and low-fat dairy. Avoid processed foods and foods high in added fat, sugar, and salt.  Exercise at least 30 minutes on 5 or more days each week.  Get 7-8 hours of sleep each night. General instructions   Practice stress management techniques as discussed with your health care provider.  Drink enough fluid to keep your urine clear or pale yellow.  Take over-the-counter and prescription medicines only as told by your health care provider.  Keep all follow-up visits as told by your health care provider. This is important. Contact a health care provider if:  Your symptoms get worse.  You have new symptoms.  You feel overwhelmed by your problems and can no longer manage them on your own. Get help right away if:  You have thoughts of hurting yourself or others. If you ever feel like you may hurt yourself or others, or have thoughts about taking your own life, get help right away. You can go to your nearest emergency department or call:  Your local emergency services (911 in the [place]  A suicide crisis helpline, such as the [place] [place] [facility] at (414)427-2200. This is open 24 hours a day. Summary  Stress is a normal reaction to life events. It can cause problems if it happens too often or for too long.  Practicing stress management techniques is the best way to treat stress.  Counseling or talk therapy with a mental health professional may be helpful if you are having trouble managing stress on your own. This information is not intended to replace advice given to you by your health care provider. Make sure you discuss any questions you have with your health care provider. Document Released: 12/13/2000 Document Revised: 06/01/2017 Document Reviewed: 08/09/2016 Elsevier Patient Education  2020 [place] for Gastroesophageal Reflux Disease, Adult When you  have gastroesophageal reflux disease (GERD), the foods you eat and your eating habits are very important. Choosing the right foods can help ease your discomfort. Think about working with a nutrition specialist (dietitian) to help you make good choices. What are tips for following this plan?  Meals  Choose healthy foods that are low in fat, such as fruits, vegetables, whole grains, low-fat dairy products, and lean meat, fish, and poultry.  Eat small meals often instead of 3 large meals a day. Eat your meals slowly, and in a place where you are relaxed. Avoid bending over or lying down until 2-3 hours after eating.  Avoid eating meals 2-3 hours before bed.  Avoid drinking a lot of liquid with meals.  Cook foods using methods other than frying. Bake, grill, or broil food instead.  Avoid or limit: ? Chocolate. ? Peppermint or spearmint. ? Alcohol. ? Pepper. ? Black and decaffeinated coffee. ? Black and decaffeinated tea. ? Bubbly (carbonated) soft drinks. ? Caffeinated energy drinks and soft drinks.  Limit high-fat foods such as: ? Fatty meat or fried foods. ? Whole milk, cream, butter, or ice cream. ? Nuts and nut butters. ? Pastries, donuts, and sweets made with butter or shortening.  Avoid foods that cause symptoms. These foods  may be different for everyone. Common foods that cause symptoms include: ? Tomatoes. ? Oranges, lemons, and limes. ? Peppers. ? Spicy food. ? Onions and garlic. ? Vinegar. Lifestyle  Maintain a healthy weight. Ask your doctor what weight is healthy for you. If you need to lose weight, work with your doctor to do so safely.  Exercise for at least 30 minutes for 5 or more days each week, or as told by your doctor.  Wear loose-fitting clothes.  Do not smoke. If you need help quitting, ask your doctor.  Sleep with the head of your bed higher than your feet. Use a wedge under the mattress or blocks under the bed frame to raise the head of the bed.  Summary  When you have gastroesophageal reflux disease (GERD), food and lifestyle choices are very important in easing your symptoms.  Eat small meals often instead of 3 large meals a day. Eat your meals slowly, and in a place where you are relaxed.  Limit high-fat foods such as fatty meat or fried foods.  Avoid bending over or lying down until 2-3 hours after eating.  Avoid peppermint and spearmint, caffeine, alcohol, and chocolate. This information is not intended to replace advice given to you by your health care provider. Make sure you discuss any questions you have with your health care provider. Document Released: 12/19/2011 Document Revised: 10/10/2018 Document Reviewed: 07/25/2016 Elsevier Patient Education  2020 [place],   Merri Ray, MD [facility].  03/29/19 10:04 AM

## 2019-03-28 NOTE — Patient Instructions (Addendum)
Omeprazole twice per day. Avoid alcohol and other heartburn triggers below. I will refer you to stomach specialist. Return to the clinic or go to the nearest emergency room if any of your symptoms worsen or new symptoms occur.  Keep a record of your blood pressures outside of the office and bring them to the next office visit. Cut back on alcohol, fast food for now, drink plenty of fluids. Recheck in 2 weeks. Try to avoid nsaids if possible - tylenol is safer.   See information below on stress and stress management.  Avoid exercise for now until you are evaluated by cardiology as there were some possible abnormalities on your EKG.  If any new or worsening chest symptoms, be seen in the emergency room.  Melatonin if needed to help sleep. recheck in 2 weeks.   Return to the clinic or go to the nearest emergency room if any of your symptoms worsen or new symptoms occur.   Stress Stress is a normal reaction to life events. Stress is what you feel when life demands more than you are used to, or more than you think you can handle. Some stress can be useful, such as studying for a test or meeting a deadline at work. Stress that occurs too often or for too long can cause problems. It can affect your emotional health and interfere with relationships and normal daily activities. Too much stress can weaken your body's defense system (immune system) and increase your risk for physical illness. If you already have a medical problem, stress can make it worse. What are the causes? All sorts of life events can cause stress. An event that causes stress for one person may not be stressful for another person. Major life events, whether positive or negative, commonly cause stress. Examples include:  Losing a job or starting a new job.  Losing a loved one.  Moving to a new town or home.  Getting married or divorced.  Having a baby.  Injury or illness. Less obvious life events can also cause stress, especially  if they occur day after day or in combination with each other. Examples include:  Working long hours.  Driving in traffic.  Caring for children.  Being in debt.  Being in a difficult relationship. What are the signs or symptoms? Stress can cause emotional symptoms, including:  Anxiety. This is feeling worried, afraid, on edge, overwhelmed, or out of control.  Anger, including irritation or impatience.  Depression. This is feeling sad, down, helpless, or guilty.  Trouble focusing, remembering, or making decisions. Stress can cause physical symptoms, including:  Aches and pains. These may affect your head, neck, back, stomach, or other areas of your body.  Tight muscles or a clenched jaw.  Low energy.  Trouble sleeping. Stress can cause unhealthy behaviors, including:  Eating to feel better (overeating) or skipping meals.  Working too much or putting off tasks.  Smoking, drinking alcohol, or using drugs to feel better. How is this diagnosed? Stress is diagnosed through an assessment by your health care provider. He or she may diagnose this condition based on:  Your symptoms and any stressful life events.  Your medical history.  Tests to rule out other causes of your symptoms. Depending on your condition, your health care provider may refer you to a specialist for further evaluation. How is this treated?  Stress management techniques are the recommended treatment for stress. Medicine is not typically recommended for the treatment of stress. Techniques to reduce your reaction  to stressful life events include:  Stress identification. Monitor yourself for symptoms of stress and identify what causes stress for you. These skills may help you to avoid or prepare for stressful events.  Time management. Set your priorities, keep a calendar of events, and learn to say "no." Taking these actions can help you avoid making too many commitments. Techniques for coping with stress  include:  Rethinking the problem. Try to think realistically about stressful events rather than ignoring them or overreacting. Try to find the positives in a stressful situation rather than focusing on the negatives.  Exercise. Physical exercise can release both physical and emotional tension. The key is to find a form of exercise that you enjoy and do it regularly.  Relaxation techniques. These relax the body and mind. The key is to find one or more that you enjoy and use the technique(s) regularly. Examples include: ? Meditation, deep breathing, or progressive relaxation techniques. ? Yoga or tai chi. ? Biofeedback, mindfulness techniques, or journaling. ? Listening to music, being out in nature, or participating in other hobbies.  Practicing a healthy lifestyle. Eat a balanced diet, drink plenty of water, limit or avoid caffeine, and get plenty of sleep.  Having a strong support network. Spend time with family, friends, or other people you enjoy being around. Express your feelings and talk things over with someone you trust. Counseling or talk therapy with a mental health professional may be helpful if you are having trouble managing stress on your own. Follow these instructions at home: Lifestyle   Avoid drugs.  Do not use any products that contain nicotine or tobacco, such as cigarettes and e-cigarettes. If you need help quitting, ask your health care provider.  Limit alcohol intake to no more than 1 drink a day for nonpregnant women and 2 drinks a day for men. One drink equals 12 oz of beer, 5 oz of wine, or 1 oz of hard liquor.  Do not use alcohol or drugs to relax.  Eat a balanced diet that includes fresh fruits and vegetables, whole grains, lean meats, fish, eggs, and beans, and low-fat dairy. Avoid processed foods and foods high in added fat, sugar, and salt.  Exercise at least 30 minutes on 5 or more days each week.  Get 7-8 hours of sleep each night. General instructions    Practice stress management techniques as discussed with your health care provider.  Drink enough fluid to keep your urine clear or pale yellow.  Take over-the-counter and prescription medicines only as told by your health care provider.  Keep all follow-up visits as told by your health care provider. This is important. Contact a health care provider if:  Your symptoms get worse.  You have new symptoms.  You feel overwhelmed by your problems and can no longer manage them on your own. Get help right away if:  You have thoughts of hurting yourself or others. If you ever feel like you may hurt yourself or others, or have thoughts about taking your own life, get help right away. You can go to your nearest emergency department or call:  Your local emergency services (911 in the [place]  A suicide crisis helpline, such as the [facility] at 817-133-6197. This is open 24 hours a day. Summary  Stress is a normal reaction to life events. It can cause problems if it happens too often or for too long.  Practicing stress management techniques is the best way to treat stress.  Counseling  or talk therapy with a mental health professional may be helpful if you are having trouble managing stress on your own. This information is not intended to replace advice given to you by your health care provider. Make sure you discuss any questions you have with your health care provider. Document Released: 12/13/2000 Document Revised: 06/01/2017 Document Reviewed: 08/09/2016 Elsevier Patient Education  2020 [place] for Gastroesophageal Reflux Disease, Adult When you have gastroesophageal reflux disease (GERD), the foods you eat and your eating habits are very important. Choosing the right foods can help ease your discomfort. Think about working with a nutrition specialist (dietitian) to help you make good choices. What are tips for following this plan?   Meals  Choose healthy foods that are low in fat, such as fruits, vegetables, whole grains, low-fat dairy products, and lean meat, fish, and poultry.  Eat small meals often instead of 3 large meals a day. Eat your meals slowly, and in a place where you are relaxed. Avoid bending over or lying down until 2-3 hours after eating.  Avoid eating meals 2-3 hours before bed.  Avoid drinking a lot of liquid with meals.  Cook foods using methods other than frying. Bake, grill, or broil food instead.  Avoid or limit: ? Chocolate. ? Peppermint or spearmint. ? Alcohol. ? Pepper. ? Black and decaffeinated coffee. ? Black and decaffeinated tea. ? Bubbly (carbonated) soft drinks. ? Caffeinated energy drinks and soft drinks.  Limit high-fat foods such as: ? Fatty meat or fried foods. ? Whole milk, cream, butter, or ice cream. ? Nuts and nut butters. ? Pastries, donuts, and sweets made with butter or shortening.  Avoid foods that cause symptoms. These foods may be different for everyone. Common foods that cause symptoms include: ? Tomatoes. ? Oranges, lemons, and limes. ? Peppers. ? Spicy food. ? Onions and garlic. ? Vinegar. Lifestyle  Maintain a healthy weight. Ask your doctor what weight is healthy for you. If you need to lose weight, work with your doctor to do so safely.  Exercise for at least 30 minutes for 5 or more days each week, or as told by your doctor.  Wear loose-fitting clothes.  Do not smoke. If you need help quitting, ask your doctor.  Sleep with the head of your bed higher than your feet. Use a wedge under the mattress or blocks under the bed frame to raise the head of the bed. Summary  When you have gastroesophageal reflux disease (GERD), food and lifestyle choices are very important in easing your symptoms.  Eat small meals often instead of 3 large meals a day. Eat your meals slowly, and in a place where you are relaxed.  Limit high-fat foods such as fatty meat  or fried foods.  Avoid bending over or lying down until 2-3 hours after eating.  Avoid peppermint and spearmint, caffeine, alcohol, and chocolate. This information is not intended to replace advice given to you by your health care provider. Make sure you discuss any questions you have with your health care provider. Document Released: 12/19/2011 Document Revised: 10/10/2018 Document Reviewed: 07/25/2016 Elsevier Patient Education  2020 [employer].

## 2019-03-29 ENCOUNTER — Encounter: Payer: Self-pay | Admitting: Family Medicine

## 2019-03-29 LAB — CBC
Hematocrit: 40.2 % (ref 37.5–51.0)
Hemoglobin: 14.2 g/dL (ref 13.0–17.7)
MCH: 32.4 pg (ref 26.6–33.0)
MCHC: 35.3 g/dL (ref 31.5–35.7)
MCV: 92 fL (ref 79–97)
Platelets: 351 10*3/uL (ref 150–450)
RBC: 4.38 x10E6/uL (ref 4.14–5.80)
RDW: 11.9 % (ref 11.6–15.4)
WBC: 5.4 10*3/uL (ref 3.4–10.8)

## 2019-03-29 LAB — COMPREHENSIVE METABOLIC PANEL
ALT: 14 IU/L (ref 0–44)
AST: 20 IU/L (ref 0–40)
Albumin/Globulin Ratio: 2 (ref 1.2–2.2)
Albumin: 4.5 g/dL (ref 3.8–4.9)
Alkaline Phosphatase: 53 IU/L (ref 39–117)
BUN/Creatinine Ratio: 9 (ref 9–20)
BUN: 13 mg/dL (ref 6–24)
Bilirubin Total: 0.2 mg/dL (ref 0.0–1.2)
CO2: 22 mmol/L (ref 20–29)
Calcium: 9.9 mg/dL (ref 8.7–10.2)
Chloride: 103 mmol/L (ref 96–106)
Creatinine, Ser: 1.39 mg/dL — ABNORMAL HIGH (ref 0.76–1.27)
GFR calc Af Amer: 65 mL/min/{1.73_m2} (ref >59)
GFR calc non Af Amer: 56 mL/min/{1.73_m2} — ABNORMAL LOW (ref >59)
Globulin, Total: 2.2 g/dL (ref 1.5–4.5)
Glucose: 93 mg/dL (ref 65–99)
Potassium: 4.5 mmol/L (ref 3.5–5.2)
Sodium: 139 mmol/L (ref 134–144)
Total Protein: 6.7 g/dL (ref 6.0–8.5)

## 2019-03-29 LAB — LIPASE: Lipase: 32 U/L (ref 13–78)

## 2019-04-01 ENCOUNTER — Encounter: Payer: Self-pay | Admitting: Internal Medicine

## 2019-04-01 ENCOUNTER — Ambulatory Visit: Payer: BC Managed Care – PPO | Admitting: Internal Medicine

## 2019-04-01 ENCOUNTER — Other Ambulatory Visit: Payer: Self-pay

## 2019-04-01 VITALS — BP 138/100 | HR 79 | Temp 97.6°F | Ht 71.0 in | Wt 194.6 lb

## 2019-04-01 DIAGNOSIS — K219 Gastro-esophageal reflux disease without esophagitis: Secondary | ICD-10-CM | POA: Diagnosis not present

## 2019-04-01 DIAGNOSIS — R9431 Abnormal electrocardiogram [ECG] [EKG]: Secondary | ICD-10-CM | POA: Diagnosis not present

## 2019-04-01 DIAGNOSIS — R0789 Other chest pain: Secondary | ICD-10-CM | POA: Diagnosis not present

## 2019-04-01 NOTE — Patient Instructions (Signed)
Medication Instructions:  Your physician recommends that you continue on your current medications.  Dr. Debara Pickett recommends that you monitor your blood pressure - Omron (arm cuff) is a good brand   If you need a refill on your cardiac medications before your next appointment, please call your pharmacy.    Follow-Up: as needed with Dr. Debara Pickett

## 2019-04-02 ENCOUNTER — Encounter: Payer: Self-pay | Admitting: Internal Medicine

## 2019-04-02 NOTE — Progress Notes (Signed)
OFFICE CONSULT NOTE  Chief Complaint:  Abnormal EKG, chest discomfort  Primary Care Physician: System, Pcp Not In  HPI:  Dillon Reeves is a 58 y.o. male who is being seen today for the evaluation of abnormal EKG and chest discomfort at the request of Wendie Agreste, MD.  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  He reports what sounds like burning chest discomfort concerning for possible reflux.  In addition he feels that at times food gets stuck in his central chest.  His chest discomfort is worse after eating meals or laying down at night.  Is not necessarily worse with exertion or relieved by rest.  He has a longstanding history of physical conditioning for his job.  Was also monitored on a yearly basis with EKGs and has had prior stress test which were negative.  He has no known cardiovascular risk factors or comorbid conditions such as hypertension, diabetes or tobacco abuse.  He recently was started on omeprazole notes already some improvement in his symptoms.  An EKG was performed in his PCPs office which showed some possible inferior ST depression and lateral T wave changes.  This was repeated in the office today and showed normal sinus rhythm at 79 without any ST depression or T wave changes.  His blood pressure was mildly elevated today in the office of 138/100, however says sometimes it is elevated in the office.  There is a history of hypertension in his family including his brother.  He has also been referred to GI for evaluation and has an upcoming appointment on October 2 with Dr. Havery Moros.  PMHx:  Past Medical History:  Diagnosis Date  . Allergy   . Anxiety   . GERD (gastroesophageal reflux disease)    takes Zantac prn  . Metacarpal bone fracture    5th finger  . 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 LUMBAR LAMINECTOMY/DECOMPRESSION  MICRODISCECTOMY N/A 03/20/2018   Procedure: LEFT LUMBAR 5- SACRUM 1 MICRODISECTOMY;  Surgeon: Phylliss Bob, MD;  Location: [facility];  Service: Orthopedics;  Laterality: N/A;  . OPEN REDUCTION INTERNAL FIXATION (ORIF) HAND Right 01/29/2015   Procedure: OPEN TREATMENT RIGHT FIFTH METACARPAL FRACTURE;  Surgeon: Milly Jakob, MD;  Location: [facility];  Service: Orthopedics;  Laterality: Right;    FAMHx:  Family History  Problem Relation Age of Onset  . Heart Problems Mother   . Anemia Mother   . Hypertension Brother     SOCHx:   reports that he has never smoked. He has never used smokeless tobacco. He reports current alcohol use. He reports that he does not use drugs.  ALLERGIES:  Allergies  Allergen Reactions  . Fish-Derived Products Anaphylaxis    All [employer].  . Other Anaphylaxis  . Peanut-Containing Drug Products Anaphylaxis    ROS: Pertinent items noted in HPI and remainder of comprehensive ROS otherwise negative.  HOME MEDS: Current Outpatient Medications on File Prior to Visit  Medication Sig Dispense Refill  . omeprazole (PRILOSEC) 20 MG capsule Take 1 capsule (20 mg total) by mouth daily. 30 capsule 1   No current facility-administered medications on file prior to visit.     LABS/IMAGING: No results found for this or any previous visit (from the past 48 hour(s)). No results found.  LIPID PANEL: No results found for: CHOL, TRIG, HDL, CHOLHDL, VLDL, LDLCALC, LDLDIRECT  WEIGHTS:  Wt Readings from Last 3 Encounters:  04/01/19 194 lb 9.6 oz (88.3 kg)  03/28/19 196 lb (88.9 kg)  03/20/18 179 lb (81.2 kg)    VITALS: BP (!) 138/100 (BP Location: Left Arm, Patient Position: Sitting, Cuff Size: Normal)   Pulse 79   Temp 97.6 F (36.4 C)   Ht 5\' 11"  (1.803 m)   Wt 194 lb 9.6 oz (88.3 kg)   BMI 27.14 kg/m   EXAM: General appearance: alert and no distress Neck: no carotid bruit, no JVD and thyroid not enlarged, symmetric, no  tenderness/mass/nodules Lungs: clear to auscultation bilaterally Heart: regular rate and rhythm Abdomen: soft, non-tender; bowel sounds normal; no masses,  no organomegaly Extremities: extremities normal, atraumatic, no cyanosis or edema Pulses: 2+ and symmetric Skin: Skin color, texture, turgor normal. No rashes or lesions Neurologic: Grossly normal Psych: Pleasant  EKG: Normal sinus rhythm 79, no ischemic changes- personally reviewed  ASSESSMENT: 1. Atypical chest pain 2. Nonspecific EKG changes 3. 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   Dillon Reeves has description of atypical chest pain with reflux symptoms and feeling of food getting stuck in his esophagus.  His EKG showed some nonspecific changes and he denies any symptoms with exertion or concerns that are anginal in nature.  He exercises regularly without any limitations.  He has blood pressure was mildly elevated today, particularly.diastolic blood pressure.  Advised him to purchase a blood pressure cuff and monitor it once to twice daily and bring those records to his primary care provider at follow-up.  He ready has had some improvement with regards to reflux and has an upcoming GI appointment in a few days.  He may have a possible stricture.  From a cardiac standpoint he would be acceptable risk to undergo endoscopy.  No further cardiac work-up is recommended at this time.  Blood pressure management will be deferred to his PCP.  Thanks again for the kind referral.  Pixie Casino, MD, FACC, [place] Director of the [facility] Diplomate of the American Board of Clinical Lipidology Attending Cardiologist  Direct Dial: [phone]  Fax: [phone]  Website:  [email or web address] Adrianne Shackleton 04/02/2019, 11:36 AM

## 2019-04-04 ENCOUNTER — Ambulatory Visit: Payer: BC Managed Care – PPO | Admitting: Gastroenterology

## 2019-04-04 ENCOUNTER — Encounter: Payer: Self-pay | Admitting: Gastroenterology

## 2019-04-04 VITALS — BP 122/90 | HR 84 | Temp 97.4°F | Ht 71.0 in | Wt 194.0 lb

## 2019-04-04 DIAGNOSIS — Z1211 Encounter for screening for malignant neoplasm of colon: Secondary | ICD-10-CM

## 2019-04-04 DIAGNOSIS — R140 Abdominal distension (gaseous): Secondary | ICD-10-CM | POA: Diagnosis not present

## 2019-04-04 DIAGNOSIS — K219 Gastro-esophageal reflux disease without esophagitis: Secondary | ICD-10-CM

## 2019-04-04 DIAGNOSIS — R1310 Dysphagia, unspecified: Secondary | ICD-10-CM | POA: Diagnosis not present

## 2019-04-04 MED ORDER — SUPREP BOWEL PREP KIT 17.5-3.13-1.6 GM/177ML PO SOLN: ORAL | 0 refills | Status: DC

## 2019-04-04 MED ORDER — OMEPRAZOLE 20 MG PO CPDR: 20.0000 mg | DELAYED_RELEASE_CAPSULE | Freq: Two times a day (BID) | ORAL | 5 refills | Status: DC

## 2019-04-04 NOTE — Patient Instructions (Addendum)
If you are age 56 or older, your body mass index should be between 23-30. Your Body mass index is 27.06 kg/m. If this is out of the aforementioned range listed, please consider follow up with your Primary Care Provider.  If you are age 47 or younger, your body mass index should be between 19-25. Your Body mass index is 27.06 kg/m. If this is out of the aformentioned range listed, please consider follow up with your Primary Care Provider.   To help prevent the possible spread of infection to our patients, communities, and staff; we will be implementing the following measures:  As of now we are not allowing any visitors/family members to accompany you to any upcoming appointments with [facility]. If you have any concerns about this please contact our office to discuss prior to the appointment.   You have been scheduled for an endoscopy and colonoscopy. Please follow the written instructions given to you at your visit today. Please pick up your prep supplies at the pharmacy within the next 1-3 days. If you use inhalers (even only as needed), please bring them with you on the day of your procedure. Your physician has requested that you go to www.startemmi.com and enter the access code given to you at your visit today. This web site gives a general overview about your procedure. However, you should still follow specific instructions given to you by our office regarding your preparation for the procedure.  We have sent the following medications to your pharmacy for you to pick up at your convenience: Omeprazole 20mg : Take twice a day  We are giving you a Low FOD-MAP diet to follow.  Thank you for entrusting me with your care and for choosing [facility], Dr. Gruver Cellar

## 2019-04-04 NOTE — Progress Notes (Signed)
HPI :  58 y/o male with a history of GERD, dysphagia, history of back surgery, referred by Dr. Cindee Lame for dysphagia and reflux.   Patient states he has had reflux and dysphagia symptoms for several months, but recent worsening last month that has really bothered him. He's had difficulty swallowing solids and pills, feels things get stuck in his lower chest when he swallows.  He has rare dysphasia to some liquids but usually has no trouble with liquids.  He also feels acid coming up into his chest causing a burning sensation.  He has had to spit out contents at times but does not overtly vomit.  He used to take over-the-counter antacids which have not worked well for him.  He was since placed on omeprazole 20 mg once a day over the past week and states he feels about 50% better on the regimen so far.  No weight loss.  He otherwise endorses significant bloating in his abdomen occurring after he eats something.  He feels significantly distended and uncomfortable.  He endorses a history of lactose intolerance and tries to avoid that.  He denies any major changes in his diet.  He denies any diarrhea.  Has occasional hard stools, but stools are often normal formed for the most part.  He denies any blood in his stools.  He has used some NSAIDs for back pain in recent weeks.  He denies any prior colonoscopy or EGD.   He denies any family history of esophageal cancer, gastric cancer, or colon cancer.     Past Medical History:  Diagnosis Date  . Allergy   . Anxiety   . GERD (gastroesophageal reflux disease)    takes Zantac prn  . Metacarpal bone fracture    5th finger  . 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 LUMBAR LAMINECTOMY/DECOMPRESSION MICRODISCECTOMY N/A 03/20/2018   Procedure: LEFT LUMBAR 5- SACRUM 1 MICRODISECTOMY;  Surgeon: Phylliss Bob, MD;  Location: [facility];  Service: Orthopedics;  Laterality: N/A;  . OPEN REDUCTION INTERNAL  FIXATION (ORIF) HAND Right 01/29/2015   Procedure: OPEN TREATMENT RIGHT FIFTH METACARPAL FRACTURE;  Surgeon: Milly Jakob, MD;  Location: [facility];  Service: Orthopedics;  Laterality: Right;   Family History  Problem Relation Age of Onset  . Heart Problems Mother   . Anemia Mother   . Hypertension Brother    Social History   Tobacco Use  . Smoking status: Never Smoker  . Smokeless tobacco: Never Used  Substance Use Topics  . Alcohol use: Yes    Comment: once or twice every two weeks varies  . Drug use: No   Current Outpatient Medications  Medication Sig Dispense Refill  . omeprazole (PRILOSEC) 20 MG capsule Take 1 capsule (20 mg total) by mouth daily. 30 capsule 1   No current facility-administered medications for this visit.    Allergies  Allergen Reactions  . Fish-Derived Products Anaphylaxis    All [employer].  . Other Anaphylaxis  . Peanut-Containing Drug Products Anaphylaxis     Review of Systems: All systems reviewed and negative except where noted in HPI.   Lab Results  Component Value Date   WBC 5.4 03/28/2019   HGB 14.2 03/28/2019   HCT 40.2 03/28/2019   MCV 92 03/28/2019   PLT 351 03/28/2019    Lab Results  Component Value Date   CREATININE 1.39 (H) 03/28/2019   BUN 13 03/28/2019  NA 139 03/28/2019   K 4.5 03/28/2019   CL 103 03/28/2019   CO2 22 03/28/2019    Lab Results  Component Value Date   ALT 14 03/28/2019   AST 20 03/28/2019   ALKPHOS 53 03/28/2019   BILITOT 0.2 03/28/2019     Physical Exam: BP 122/90   Pulse 84   Temp (!) 97.4 F (36.3 C)   Ht 5\' 11"  (1.803 m)   Wt 194 lb (88 kg)   BMI 27.06 kg/m  Constitutional: Pleasant,well-developed, male in no acute distress. HEENT: Normocephalic and atraumatic. Conjunctivae are normal. No scleral icterus. Neck supple.  Cardiovascular: Normal rate, regular rhythm.  Pulmonary/chest: Effort normal and breath sounds normal. No wheezing, rales or rhonchi. Abdominal:  Soft, nondistended, nontender. There are no masses palpable. No hepatomegaly. Extremities: no edema Lymphadenopathy: No cervical adenopathy noted. Neurological: Alert and oriented to person place and time. Skin: Skin is warm and dry. No rashes noted. Psychiatric: Normal mood and affect. Behavior is normal.   ASSESSMENT AND PLAN: 58 year old male here for new patient assessment of the following:  GERD / Dysphagia / Bloating - main symptoms bothering him are dysphagia and reflux.  So far he has had some improvement with omeprazole 20 mg once a day but not yet resolved.  I discussed differential with him.  I suspect he may have a peptic stricture related to reflux, however needs to be ruled out for mass lesion, other.  I am recommending an EGD to further evaluate the symptoms and potentially dilate his esophagus pending the findings if he has any obvious stricture.  I discussed risks and benefits of endoscopy and anesthesia with him and he want to proceed.  Further recommendations pending results.  In the interim given his positive result with low-dose omeprazole thus far, will increase omeprazole to 20 mg twice a day to see if that provides any additional benefit.  We otherwise discussed differential for bloating and physiology behind intestinal gas.  He is going to see if he notices any obvious food triggers and gave him a copy of a low FODMAP diet to see if that might help.  Further recommendations pending his course and results of EGD.  He agreed.  Colon cancer screening - overdue for colon cancer screening, he does not have any concerning bowel habit changes, only bloating is his main symptom.  I am recommending colonoscopy to perform his colon cancer screening.  We discussed risks and benefits of the exam as well as that of anesthesia.  He wants to proceed with this at the same time as his upper endoscopy.  Further recommendations pending the results.  Cacao Cellar, MD [facility]  CC: Wendie Agreste, MD

## 2019-04-07 ENCOUNTER — Encounter: Payer: Self-pay | Admitting: Gastroenterology

## 2019-04-11 ENCOUNTER — Other Ambulatory Visit: Payer: Self-pay

## 2019-04-11 ENCOUNTER — Ambulatory Visit: Payer: BC Managed Care – PPO | Admitting: Family Medicine

## 2019-04-11 ENCOUNTER — Encounter: Payer: Self-pay | Admitting: Family Medicine

## 2019-04-11 VITALS — BP 143/89 | HR 79 | Temp 98.4°F | Wt 196.0 lb

## 2019-04-11 DIAGNOSIS — R140 Abdominal distension (gaseous): Secondary | ICD-10-CM

## 2019-04-11 DIAGNOSIS — K2100 Gastro-esophageal reflux disease with esophagitis, without bleeding: Secondary | ICD-10-CM

## 2019-04-11 DIAGNOSIS — F439 Reaction to severe stress, unspecified: Secondary | ICD-10-CM

## 2019-04-11 DIAGNOSIS — F5104 Psychophysiologic insomnia: Secondary | ICD-10-CM

## 2019-04-11 DIAGNOSIS — I10 Essential (primary) hypertension: Secondary | ICD-10-CM

## 2019-04-11 DIAGNOSIS — R0789 Other chest pain: Secondary | ICD-10-CM

## 2019-04-11 DIAGNOSIS — F411 Generalized anxiety disorder: Secondary | ICD-10-CM

## 2019-04-11 MED ORDER — HYDROXYZINE HCL 25 MG PO TABS: 12.5000 mg | ORAL_TABLET | Freq: Three times a day (TID) | ORAL | 0 refills | Status: DC | PRN

## 2019-04-11 MED ORDER — AMLODIPINE BESYLATE 2.5 MG PO TABS: 2.5000 mg | ORAL_TABLET | Freq: Every day | ORAL | 1 refills | Status: DC

## 2019-04-11 NOTE — Progress Notes (Signed)
Subjective:    Patient ID: Dillon Reeves, male    DOB: [birth year], 58 y.o.   MRN: [id number]  HPI Dillon Reeves is a 58 y.o. male Presents today for: Chief Complaint  Patient presents with  . Follow-up     2 week f/u on bp and stress. The acid reflex is better but still have gas and bloating   Hypertension: BP Readings from Last 3 Encounters:  04/11/19 (!) 143/89  04/04/19 122/90  04/01/19 (!) 138/100   Lab Results  Component Value Date   CREATININE 1.39 (H) 03/28/2019  few home readings- "all over the place", not sure if using the right way. 150/?, 133/88, 134/80.  No BP meds at this time. Prior on BP meds years ago. Side effects with few  meds in the past -gagging? Prior rx meds on [facility]: clonidine, labetalol,   Chest pain: Evaluated September 25th.  Suspected uncontrolled GERD with possible esophagitis, achalasia at that time.  Will start omeprazole twice per day, referred to gastroenterology.  Evaluated with cardiology September 29.  Atypical chest pain.  EKG with nonspecific changes, no anginal symptoms.  Reported some improvement in GI symptoms no further work-up from a cardiac standpoint was recommended and blood pressure management deferred to me.  Evaluated 1 week ago with Dr. Havery Moros.  Possible peptic stricture related to reflux, recommended EGD with possible dilation depending on that exam.  Was taking omeprazole once per day, recommended increase to twice daily.  Low FODMAP diet discussed if food triggers for bloating.  Also planned for colonoscopy as overdue, plan at same time as EGD. Chest symptoms are better with twice daily omeprazole. Still some bloating. Bloating with any diet. Some gas passed, sometimes feels like it is stuck.  Has cut back on possible triggers.  EGD planned in 4 days.  No vomiting. drinking fluids.  Has cut back on alcohol, few shots last weekend.   Stress/anxiety: Stressful job. Stress with Covid. Handout given last visit.  Trying calming techniques. Trying to sleep better, but has not tried melatonin. Still some times with difficulty sleeping.   Depression screen [facility] 2/9 04/11/2019 03/28/2019 03/05/2018 02/25/2018 10/30/2016  Decreased Interest 0 0 0 0 0  Down, Depressed, Hopeless 0 0 0 0 0  PHQ - 2 Score 0 0 0 0 0     Patient Active Problem List   Diagnosis Date Noted  . Acute left-sided low back pain with left-sided sciatica 10/30/2016  . 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 Allergy   . Anxiety   . GERD (gastroesophageal reflux disease)    takes Zantac prn  . Metacarpal bone fracture    5th finger  . 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 LUMBAR LAMINECTOMY/DECOMPRESSION MICRODISCECTOMY N/A 03/20/2018   Procedure: LEFT LUMBAR 5- SACRUM 1 MICRODISECTOMY;  Surgeon: Phylliss Bob, MD;  Location: [place];  Service: Orthopedics;  Laterality: N/A;  . OPEN REDUCTION INTERNAL FIXATION (ORIF) HAND Right 01/29/2015   Procedure: OPEN TREATMENT RIGHT FIFTH METACARPAL FRACTURE;  Surgeon: Milly Jakob, MD;  Location: [place];  Service: Orthopedics;  Laterality: Right;   Allergies  Allergen Reactions  . Fish-Derived Products Anaphylaxis    All [employer].  . Other Anaphylaxis  . Peanut-Containing Drug Products Anaphylaxis   Prior to Admission medications   Medication Sig Start Date End Date Taking? Authorizing Provider  omeprazole (PRILOSEC) 20 MG capsule Take  1 capsule (20 mg total) by mouth 2 (two) times daily before a meal. 04/04/19  Yes Armbruster, Carlota Raspberry, MD  SUPREP BOWEL PREP KIT 17.5-3.13-1.6 GM/177ML SOLN Suprep-Use as directed 04/04/19  Yes Armbruster, Carlota Raspberry, MD   Social History   Socioeconomic History  . Marital status: Married    Spouse name: separated  . Number of children: 3  . Years of education: 2+ years college  . Highest education level: Not on file  Occupational History  . Occupation:  delivery truck Education administrator: [employer]  . Occupation: bus Education administrator: [employer]  . Financial resource strain: Not on file  . Food insecurity    Worry: Not on file    Inability: Not on file  . Transportation needs    Medical: Not on file    Non-medical: Not on file  Tobacco Use  . Smoking status: Never Smoker  . Smokeless tobacco: Never Used  Substance and Sexual Activity  . Alcohol use: Yes    Comment: once or twice every two weeks varies  . Drug use: No  . Sexual activity: Yes  Lifestyle  . Physical activity    Days per week: Not on file    Minutes per session: Not on file  . Stress: Not on file  Relationships  . Social Herbalist on phone: Not on file    Gets together: Not on file    Attends religious service: Not on file    Active member of club or organization: Not on file    Attends meetings of clubs or organizations: Not on file    Relationship status: Not on file  . Intimate partner violence    Fear of current or ex partner: Not on file    Emotionally abused: Not on file    Physically abused: Not on file    Forced sexual activity: Not on file  Other Topics Concern  . Not on file  Social History Narrative   Lives with his wife and their children.   Separating from wife (08/2016).    Review of Systems Per HPI.     Objective:   Physical Exam Vitals signs reviewed.  Constitutional:      Appearance: He is well-developed.  HENT:     Head: Normocephalic and atraumatic.  Eyes:     Pupils: Pupils are equal, round, and reactive to light.  Neck:     Vascular: No carotid bruit or JVD.  Cardiovascular:     Rate and Rhythm: Normal rate and regular rhythm.     Heart sounds: Normal heart sounds. No murmur.  Pulmonary:     Effort: Pulmonary effort is normal.     Breath sounds: Normal breath sounds. No rales.  Skin:    General: Skin is warm and dry.  Neurological:     Mental Status: He is alert and oriented  to person, place, and time.  Psychiatric:        Attention and Perception: Attention normal.        Mood and Affect: Mood is anxious.        Speech: Speech normal.        Behavior: Behavior normal.        Thought Content: Thought content normal.    Vitals:   04/11/19 1056 04/11/19 1059  BP: (!) 160/100 (!) 143/89  Pulse: 79   Temp: 98.4 F (36.9 C)   TempSrc: Oral  SpO2: 98%   Weight: 196 lb (88.9 kg)       Assessment & Plan:    Dillon Reeves is a 57 y.o. male Essential hypertension - Plan: amLODipine (NORVASC) 2.5 MG tablet  -Borderline, with improved home readings.  Check home readings and if over 140/90, start amlodipine.  If new side effects on that medication, return to discuss.  Otherwise recheck 2 weeks.  Situational stress - Plan: hydrOXYzine (ATARAX/VISTARIL) 25 MG tablet Anxiety state - Plan: hydrOXYzine (ATARAX/VISTARIL) 25 MG tablet Psychophysiological insomnia - Plan: hydrOXYzine (ATARAX/VISTARIL) 25 MG tablet  -Counseling recommended, number provided.  Hydroxyzine if needed for breakthrough anxiety symptoms, potential side effects and sedation discussed.  Can also be used for sleep.  Continue to work on Marine scientist, Audiological scientist.  Chest discomfort Abdominal bloating Gastroesophageal reflux disease with esophagitis, unspecified whether hemorrhage  -Some improvement on chest symptoms with PPI, continue twice daily dosing, plan for EGD in 4 days.  Avoid FODMAPs for bloating but also discuss symptoms with gastroenterology.  ER precautions given.  Meds ordered this encounter  Medications  . amLODipine (NORVASC) 2.5 MG tablet    Sig: Take 1 tablet (2.5 mg total) by mouth daily.    Dispense:  30 tablet    Refill:  1  . hydrOXYzine (ATARAX/VISTARIL) 25 MG tablet    Sig: Take 0.5-1 tablets (12.5-25 mg total) by mouth 3 (three) times daily as needed for anxiety (or sleep.).    Dispense:  30 tablet    Refill:  0   Patient Instructions     I would recommend calling Dr. Doyne Keel office if worsening bloating for other recommendations.  Return to the clinic or go to the nearest emergency room if any of your symptoms worsen or new symptoms occur.  IF blood pressure over 140/90 at home more than once, can try amlodipine once per day. Stop if new side effects with that med. Follow up in next 2 weeks to recheck.   For anxiety, see information below.  Hydroxyzine 1/2 to 1 pill up to 3 times per day or at bedtime if needed for sleep or anxiety but that medication can cause sedation.  Do not drive or operate machinery if taking that medicine.  I also recommend calling therapist for appointment.  [facility]: [phone]   Stress Stress is a normal reaction to life events. Stress is what you feel when life demands more than you are used to, or more than you think you can handle. Some stress can be useful, such as studying for a test or meeting a deadline at work. Stress that occurs too often or for too long can cause problems. It can affect your emotional health and interfere with relationships and normal daily activities. Too much stress can weaken your body's defense system (immune system) and increase your risk for physical illness. If you already have a medical problem, stress can make it worse. What are the causes? All sorts of life events can cause stress. An event that causes stress for one person may not be stressful for another person. Major life events, whether positive or negative, commonly cause stress. Examples include:  Losing a job or starting a new job.  Losing a loved one.  Moving to a new town or home.  Getting married or divorced.  Having a baby.  Injury or illness. Less obvious life events can also cause stress, especially if they occur day after day or in combination with each other. Examples include:  Working long hours.  Driving in traffic.  Caring for children.  Being in debt.   Being in a difficult relationship. What are the signs or symptoms? Stress can cause emotional symptoms, including:  Anxiety. This is feeling worried, afraid, on edge, overwhelmed, or out of control.  Anger, including irritation or impatience.  Depression. This is feeling sad, down, helpless, or guilty.  Trouble focusing, remembering, or making decisions. Stress can cause physical symptoms, including:  Aches and pains. These may affect your head, neck, back, stomach, or other areas of your body.  Tight muscles or a clenched jaw.  Low energy.  Trouble sleeping. Stress can cause unhealthy behaviors, including:  Eating to feel better (overeating) or skipping meals.  Working too much or putting off tasks.  Smoking, drinking alcohol, or using drugs to feel better. How is this diagnosed? Stress is diagnosed through an assessment by your health care provider. He or she may diagnose this condition based on:  Your symptoms and any stressful life events.  Your medical history.  Tests to rule out other causes of your symptoms. Depending on your condition, your health care provider may refer you to a specialist for further evaluation. How is this treated?  Stress management techniques are the recommended treatment for stress. Medicine is not typically recommended for the treatment of stress. Techniques to reduce your reaction to stressful life events include:  Stress identification. Monitor yourself for symptoms of stress and identify what causes stress for you. These skills may help you to avoid or prepare for stressful events.  Time management. Set your priorities, keep a calendar of events, and learn to say "no." Taking these actions can help you avoid making too many commitments. Techniques for coping with stress include:  Rethinking the problem. Try to think realistically about stressful events rather than ignoring them or overreacting. Try to find the positives in a stressful  situation rather than focusing on the negatives.  Exercise. Physical exercise can release both physical and emotional tension. The key is to find a form of exercise that you enjoy and do it regularly.  Relaxation techniques. These relax the body and mind. The key is to find one or more that you enjoy and use the technique(s) regularly. Examples include: ? Meditation, deep breathing, or progressive relaxation techniques. ? Yoga or tai chi. ? Biofeedback, mindfulness techniques, or journaling. ? Listening to music, being out in nature, or participating in other hobbies.  Practicing a healthy lifestyle. Eat a balanced diet, drink plenty of water, limit or avoid caffeine, and get plenty of sleep.  Having a strong support network. Spend time with family, friends, or other people you enjoy being around. Express your feelings and talk things over with someone you trust. Counseling or talk therapy with a mental health professional may be helpful if you are having trouble managing stress on your own. Follow these instructions at home: Lifestyle   Avoid drugs.  Do not use any products that contain nicotine or tobacco, such as cigarettes and e-cigarettes. If you need help quitting, ask your health care provider.  Limit alcohol intake to no more than 1 drink a day for nonpregnant women and 2 drinks a day for men. One drink equals 12 oz of beer, 5 oz of wine, or 1 oz of hard liquor.  Do not use alcohol or drugs to relax.  Eat a balanced diet that includes fresh fruits and vegetables, whole grains, lean meats, fish, eggs, and beans, and low-fat dairy. Avoid processed foods  and foods high in added fat, sugar, and salt.  Exercise at least 30 minutes on 5 or more days each week.  Get 7-8 hours of sleep each night. General instructions   Practice stress management techniques as discussed with your health care provider.  Drink enough fluid to keep your urine clear or pale yellow.  Take  over-the-counter and prescription medicines only as told by your health care provider.  Keep all follow-up visits as told by your health care provider. This is important. Contact a health care provider if:  Your symptoms get worse.  You have new symptoms.  You feel overwhelmed by your problems and can no longer manage them on your own. Get help right away if:  You have thoughts of hurting yourself or others. If you ever feel like you may hurt yourself or others, or have thoughts about taking your own life, get help right away. You can go to your nearest emergency department or call:  Your local emergency services (911 in the [place]  A suicide crisis helpline, such as the [place] at 7607373339. This is open 24 hours a day. Summary  Stress is a normal reaction to life events. It can cause problems if it happens too often or for too long.  Practicing stress management techniques is the best way to treat stress.  Counseling or talk therapy with a mental health professional may be helpful if you are having trouble managing stress on your own. This information is not intended to replace advice given to you by your health care provider. Make sure you discuss any questions you have with your health care provider. Document Released: 12/13/2000 Document Revised: 06/01/2017 Document Reviewed: 08/09/2016 Elsevier Patient Education  [employer].   If you have lab work done today you will be contacted with your lab results within the next 2 weeks.  If you have not heard from [place] then please contact us. The fastest way to get your results is to register for My Chart.   IF you received an x-ray today, you will receive an invoice from [facility]. Please contact [facility] at [phone] with questions or concerns regarding your invoice.   IF you received labwork today, you will receive an invoice from [facility]. Please contact [facility] at  [phone] with questions or concerns regarding your invoice.   Our billing staff will not be able to assist you with questions regarding bills from these companies.  You will be contacted with the lab results as soon as they are available. The fastest way to get your results is to activate your My Chart account. Instructions are located on the last page of this paperwork. If you have not heard from [place] regarding the results in 2 weeks, please contact this office.       Signed,   Merri Ray, MD [facility].  04/11/19 1:51 PM

## 2019-04-11 NOTE — Patient Instructions (Addendum)
I would recommend calling Dr. Doyne Keel office if worsening bloating for other recommendations.  Return to the clinic or go to the nearest emergency room if any of your symptoms worsen or new symptoms occur.  IF blood pressure over 140/90 at home more than once, can try amlodipine once per day. Stop if new side effects with that med. Follow up in next 2 weeks to recheck.   For anxiety, see information below.  Hydroxyzine 1/2 to 1 pill up to 3 times per day or at bedtime if needed for sleep or anxiety but that medication can cause sedation.  Do not drive or operate machinery if taking that medicine.  I also recommend calling therapist for appointment.  [facility]: [phone]   Stress Stress is a normal reaction to life events. Stress is what you feel when life demands more than you are used to, or more than you think you can handle. Some stress can be useful, such as studying for a test or meeting a deadline at work. Stress that occurs too often or for too long can cause problems. It can affect your emotional health and interfere with relationships and normal daily activities. Too much stress can weaken your body's defense system (immune system) and increase your risk for physical illness. If you already have a medical problem, stress can make it worse. What are the causes? All sorts of life events can cause stress. An event that causes stress for one person may not be stressful for another person. Major life events, whether positive or negative, commonly cause stress. Examples include:  Losing a job or starting a new job.  Losing a loved one.  Moving to a new town or home.  Getting married or divorced.  Having a baby.  Injury or illness. Less obvious life events can also cause stress, especially if they occur day after day or in combination with each other. Examples include:  Working long hours.  Driving in traffic.  Caring for children.  Being in  debt.  Being in a difficult relationship. What are the signs or symptoms? Stress can cause emotional symptoms, including:  Anxiety. This is feeling worried, afraid, on edge, overwhelmed, or out of control.  Anger, including irritation or impatience.  Depression. This is feeling sad, down, helpless, or guilty.  Trouble focusing, remembering, or making decisions. Stress can cause physical symptoms, including:  Aches and pains. These may affect your head, neck, back, stomach, or other areas of your body.  Tight muscles or a clenched jaw.  Low energy.  Trouble sleeping. Stress can cause unhealthy behaviors, including:  Eating to feel better (overeating) or skipping meals.  Working too much or putting off tasks.  Smoking, drinking alcohol, or using drugs to feel better. How is this diagnosed? Stress is diagnosed through an assessment by your health care provider. He or she may diagnose this condition based on:  Your symptoms and any stressful life events.  Your medical history.  Tests to rule out other causes of your symptoms. Depending on your condition, your health care provider may refer you to a specialist for further evaluation. How is this treated?  Stress management techniques are the recommended treatment for stress. Medicine is not typically recommended for the treatment of stress. Techniques to reduce your reaction to stressful life events include:  Stress identification. Monitor yourself for symptoms of stress and identify what causes stress for you. These skills may help you to avoid or prepare for stressful events.  Time management. Set  your priorities, keep a calendar of events, and learn to say "no." Taking these actions can help you avoid making too many commitments. Techniques for coping with stress include:  Rethinking the problem. Try to think realistically about stressful events rather than ignoring them or overreacting. Try to find the positives in a  stressful situation rather than focusing on the negatives.  Exercise. Physical exercise can release both physical and emotional tension. The key is to find a form of exercise that you enjoy and do it regularly.  Relaxation techniques. These relax the body and mind. The key is to find one or more that you enjoy and use the technique(s) regularly. Examples include: ? Meditation, deep breathing, or progressive relaxation techniques. ? Yoga or tai chi. ? Biofeedback, mindfulness techniques, or journaling. ? Listening to music, being out in nature, or participating in other hobbies.  Practicing a healthy lifestyle. Eat a balanced diet, drink plenty of water, limit or avoid caffeine, and get plenty of sleep.  Having a strong support network. Spend time with family, friends, or other people you enjoy being around. Express your feelings and talk things over with someone you trust. Counseling or talk therapy with a mental health professional may be helpful if you are having trouble managing stress on your own. Follow these instructions at home: Lifestyle   Avoid drugs.  Do not use any products that contain nicotine or tobacco, such as cigarettes and e-cigarettes. If you need help quitting, ask your health care provider.  Limit alcohol intake to no more than 1 drink a day for nonpregnant women and 2 drinks a day for men. One drink equals 12 oz of beer, 5 oz of wine, or 1 oz of hard liquor.  Do not use alcohol or drugs to relax.  Eat a balanced diet that includes fresh fruits and vegetables, whole grains, lean meats, fish, eggs, and beans, and low-fat dairy. Avoid processed foods and foods high in added fat, sugar, and salt.  Exercise at least 30 minutes on 5 or more days each week.  Get 7-8 hours of sleep each night. General instructions   Practice stress management techniques as discussed with your health care provider.  Drink enough fluid to keep your urine clear or pale yellow.  Take  over-the-counter and prescription medicines only as told by your health care provider.  Keep all follow-up visits as told by your health care provider. This is important. Contact a health care provider if:  Your symptoms get worse.  You have new symptoms.  You feel overwhelmed by your problems and can no longer manage them on your own. Get help right away if:  You have thoughts of hurting yourself or others. If you ever feel like you may hurt yourself or others, or have thoughts about taking your own life, get help right away. You can go to your nearest emergency department or call:  Your local emergency services (911 in the [place]  A suicide crisis helpline, such as the [facility] at 512-877-3555. This is open 24 hours a day. Summary  Stress is a normal reaction to life events. It can cause problems if it happens too often or for too long.  Practicing stress management techniques is the best way to treat stress.  Counseling or talk therapy with a mental health professional may be helpful if you are having trouble managing stress on your own. This information is not intended to replace advice given to you by your health care provider.  Make sure you discuss any questions you have with your health care provider. Document Released: 12/13/2000 Document Revised: 06/01/2017 Document Reviewed: 08/09/2016 Elsevier Patient Education  [employer].   If you have lab work done today you will be contacted with your lab results within the next 2 weeks.  If you have not heard from [place] then please contact us. The fastest way to get your results is to register for My Chart.   IF you received an x-ray today, you will receive an invoice from [facility]. Please contact [facility] at [phone] with questions or concerns regarding your invoice.   IF you received labwork today, you will receive an invoice from [facility]. Please contact [facility] at  [phone] with questions or concerns regarding your invoice.   Our billing staff will not be able to assist you with questions regarding bills from these companies.  You will be contacted with the lab results as soon as they are available. The fastest way to get your results is to activate your My Chart account. Instructions are located on the last page of this paperwork. If you have not heard from [place] regarding the results in 2 weeks, please contact this office.

## 2019-04-14 ENCOUNTER — Telehealth: Payer: Self-pay

## 2019-04-14 NOTE — Telephone Encounter (Signed)
Covid-19 screening questions   Do you now or have you had a fever in the last 14 days?  Do you have any respiratory symptoms of shortness of breath or cough now or in the last 14 days?  Do you have any family members or close contacts with diagnosed or suspected Covid-19 in the past 14 days?  Have you been tested for Covid-19 and found to be positive?       

## 2019-04-14 NOTE — Telephone Encounter (Signed)
Pt responded "no" to all screening questions °

## 2019-04-15 ENCOUNTER — Encounter: Payer: Self-pay | Admitting: Gastroenterology

## 2019-04-15 ENCOUNTER — Ambulatory Visit (AMBULATORY_SURGERY_CENTER): Payer: BC Managed Care – PPO | Admitting: Gastroenterology

## 2019-04-15 ENCOUNTER — Other Ambulatory Visit: Payer: Self-pay

## 2019-04-15 VITALS — BP 91/60 | HR 75 | Temp 98.7°F | Resp 13 | Ht 71.0 in | Wt 194.0 lb

## 2019-04-15 DIAGNOSIS — K219 Gastro-esophageal reflux disease without esophagitis: Secondary | ICD-10-CM

## 2019-04-15 DIAGNOSIS — K222 Esophageal obstruction: Secondary | ICD-10-CM | POA: Diagnosis not present

## 2019-04-15 DIAGNOSIS — D125 Benign neoplasm of sigmoid colon: Secondary | ICD-10-CM | POA: Diagnosis not present

## 2019-04-15 DIAGNOSIS — Z1211 Encounter for screening for malignant neoplasm of colon: Secondary | ICD-10-CM | POA: Diagnosis present

## 2019-04-15 DIAGNOSIS — K449 Diaphragmatic hernia without obstruction or gangrene: Secondary | ICD-10-CM | POA: Diagnosis not present

## 2019-04-15 DIAGNOSIS — D123 Benign neoplasm of transverse colon: Secondary | ICD-10-CM

## 2019-04-15 DIAGNOSIS — R1310 Dysphagia, unspecified: Secondary | ICD-10-CM | POA: Diagnosis not present

## 2019-04-15 MED ORDER — SODIUM CHLORIDE 0.9 % IV SOLN: 500.0000 mL | Freq: Once | INTRAVENOUS | Status: DC

## 2019-04-15 NOTE — Progress Notes (Signed)
Temperature taken by J.B., VS taken by C.W. 

## 2019-04-15 NOTE — Progress Notes (Signed)
PT taken to PACU. Monitors in place. VSS. Report given to RN. 

## 2019-04-15 NOTE — Patient Instructions (Addendum)
Handouts provided on: Hiatal hernia, Polyps, and Hemorrhoids.  All polyps removed have been sent off for pathology and results usually take 10-14 days to receive.  You will need to follow the post dilation diet that is included with your information.  This involves nothing to eat or drink until 6pm.  Then only clear liquids from 6pm - 7pm, followed by a soft diet for the remainder of the day.    You may continue your current medication schedule after 7 pm.  YOU HAD AN ENDOSCOPIC PROCEDURE TODAY AT [facility]:   Refer to the procedure report that was given to you for any specific questions about what was found during the examination.  If the procedure report does not answer your questions, please call your gastroenterologist to clarify.  If you requested that your care partner not be given the details of your procedure findings, then the procedure report has been included in a sealed envelope for you to review at your convenience later.  YOU SHOULD EXPECT: Some feelings of bloating in the abdomen. Passage of more gas than usual.  Walking can help get rid of the air that was put into your GI tract during the procedure and reduce the bloating. If you had a lower endoscopy (such as a colonoscopy or flexible sigmoidoscopy) you may notice spotting of blood in your stool or on the toilet paper. If you underwent a bowel prep for your procedure, you may not have a normal bowel movement for a few days.  Please Note:  You might notice some irritation and congestion in your nose or some drainage.  This is from the oxygen used during your procedure.  There is no need for concern and it should clear up in a day or so.  SYMPTOMS TO REPORT IMMEDIATELY:   Following lower endoscopy (colonoscopy or flexible sigmoidoscopy):  Excessive amounts of blood in the stool  Significant tenderness or worsening of abdominal pains  Swelling of the abdomen that is new, acute  Fever of 100F or  higher   Following upper endoscopy (EGD)  Vomiting of blood or coffee ground material  New chest pain or pain under the shoulder blades  Painful or persistently difficult swallowing  New shortness of breath  Fever of 100F or higher  Black, tarry-looking stools  For urgent or emergent issues, a gastroenterologist can be reached at any hour by calling [phone].   DIET:  We do recommend a small meal at first, but then you may proceed to your regular diet.  Drink plenty of fluids but you should avoid alcoholic beverages for 24 hours.  ACTIVITY:  You should plan to take it easy for the rest of today and you should NOT DRIVE or use heavy machinery until tomorrow (because of the sedation medicines used during the test).    FOLLOW UP: Our staff will call the number listed on your records 48-72 hours following your procedure to check on you and address any questions or concerns that you may have regarding the information given to you following your procedure. If we do not reach you, we will leave a message.  We will attempt to reach you two times.  During this call, we will ask if you have developed any symptoms of COVID 19. If you develop any symptoms (ie: fever, flu-like symptoms, shortness of breath, cough etc.) before then, please call [phone].  If you test positive for Covid 19 in the 2 weeks post procedure, please call and report this  information to [place].    If any biopsies were taken you will be contacted by phone or by letter within the next 1-3 weeks.  Please call us at [phone] if you have not heard about the biopsies in 3 weeks.    SIGNATURES/CONFIDENTIALITY: You and/or your care partner have signed paperwork which will be entered into your electronic medical record.  These signatures attest to the fact that that the information above on your After Visit Summary has been reviewed and is understood.  Full responsibility of the confidentiality of this discharge information  lies with you and/or your care-partner.

## 2019-04-15 NOTE — Op Note (Signed)
[facility] Patient Name: Dillon Reeves Procedure Date: 04/15/2019 3:43 PM MRN: [id number] Endoscopist: Remo Lipps P. Havery Moros , MD Age: 58 Referring MD:  Date of Birth: 27-Jun-1961 Gender: Male Account #: [id number] Procedure:                Upper GI endoscopy Indications:              Dysphagia, Follow-up of esophageal reflux,                            currently on omeprazole 20mg  twice daily Medicines:                Monitored Anesthesia Care Procedure:                Pre-Anesthesia Assessment:                           - Prior to the procedure, a History and Physical                            was performed, and patient medications and                            allergies were reviewed. The patient's tolerance of                            previous anesthesia was also reviewed. The risks                            and benefits of the procedure and the sedation                            options and risks were discussed with the patient.                            All questions were answered, and informed consent                            was obtained. Prior Anticoagulants: The patient has                            taken no previous anticoagulant or antiplatelet                            agents. ASA Grade Assessment: II - A patient with                            mild systemic disease. After reviewing the risks                            and benefits, the patient was deemed in                            satisfactory condition to undergo the procedure.  After obtaining informed consent, the endoscope was                            passed under direct vision. Throughout the                            procedure, the patient's blood pressure, pulse, and                            oxygen saturations were monitored continuously. The                            Endoscope was introduced through the mouth, and                            advanced to  the second part of duodenum. The upper                            GI endoscopy was accomplished without difficulty.                            The patient tolerated the procedure well. Scope In: Scope Out: Findings:                 Esophagogastric landmarks were identified: the                            Z-line was found at 35 cm, the gastroesophageal                            junction was found at 35 cm and the upper extent of                            the gastric folds was found at 40 cm from the                            incisors.                           A 5 cm hiatal hernia was present.                           One benign-appearing, intrinsic moderate stenosis                            was found 35 cm from the incisors. This stenosis                            measured less than one cm (in length). A TTS                            dilator was passed through the scope. Dilation with  a 16-17-18 mm balloon dilator was performed to 16                            mm and 17 mm after which an appropriate mucosal                            wrent was noted.                           Mucosal changes including ringed esophagus and                            longitudinal furrows were found in the lower third                            of the esophagus. Biopsies were obtained from the                            proximal and distal esophagus with cold forceps for                            histology of suspected eosinophilic esophagitis.                           The exam of the esophagus was otherwise normal.                           The entire examined stomach was normal.                           The duodenal bulb and second portion of the                            duodenum were normal. Complications:            No immediate complications. Estimated blood loss:                            Minimal. Estimated Blood Loss:     Estimated blood loss was  minimal. Impression:               - Esophagogastric landmarks identified.                           - 5 cm hiatal hernia.                           - Benign-appearing esophageal stenosis. Dilated to                            70mm with good result.                           - Esophageal mucosal changes suspicious for  possible eosinophilic esophagitis. Biopsied.                           - Normal stomach.                           - Normal duodenal bulb and second portion of the                            duodenum. Recommendation:           - Patient has a contact number available for                            emergencies. The signs and symptoms of potential                            delayed complications were discussed with the                            patient. Return to normal activities tomorrow.                            Written discharge instructions were provided to the                            patient.                           - Post dilation diet.                           - Continue present medications.                           - Await pathology results and course post dilation [name] P. [name], MD 04/15/2019 4:45:42 PM This report has been signed electronically.

## 2019-04-15 NOTE — Op Note (Signed)
[facility] Patient Name: Dillon Reeves Procedure Date: 04/15/2019 3:42 PM MRN: [id number] Endoscopist: Remo Lipps P. Havery Moros , MD Age: 58 Referring MD:  Date of Birth: 06/17/1961 Gender: Male Account #: [id number] Procedure:                Colonoscopy Indications:              Screening for colorectal malignant neoplasm, This                            is the patient's first colonoscopy Medicines:                Monitored Anesthesia Care Procedure:                Pre-Anesthesia Assessment:                           - Prior to the procedure, a History and Physical                            was performed, and patient medications and                            allergies were reviewed. The patient's tolerance of                            previous anesthesia was also reviewed. The risks                            and benefits of the procedure and the sedation                            options and risks were discussed with the patient.                            All questions were answered, and informed consent                            was obtained. Prior Anticoagulants: The patient has                            taken no previous anticoagulant or antiplatelet                            agents. ASA Grade Assessment: II - A patient with                            mild systemic disease. After reviewing the risks                            and benefits, the patient was deemed in                            satisfactory condition to undergo the procedure.  After obtaining informed consent, the colonoscope                            was passed under direct vision. Throughout the                            procedure, the patient's blood pressure, pulse, and                            oxygen saturations were monitored continuously. The                            Colonoscope was introduced through the anus and                            advanced to the  the cecum, identified by                            appendiceal orifice and ileocecal valve. The                            colonoscopy was performed without difficulty. The                            patient tolerated the procedure well. The quality                            of the bowel preparation was good. The ileocecal                            valve, appendiceal orifice, and rectum were                            photographed. Scope In: 4:06:28 PM Scope Out: 4:26:38 PM Scope Withdrawal Time: 0 hours 15 minutes 33 seconds  Total Procedure Duration: 0 hours 20 minutes 10 seconds  Findings:                 The perianal and digital rectal examinations were                            normal.                           A 4 mm polyp was found in the transverse colon. The                            polyp was sessile. The polyp was removed with a                            cold snare. Resection and retrieval were complete.                           A 4 to 5 mm polyp was found in the sigmoid colon.  The polyp was sessile. The polyp was removed with a                            cold snare. Resection and retrieval were complete.                           Internal hemorrhoids were found during                            retroflexion. The hemorrhoids were small.                           The exam was otherwise without abnormality. Complications:            No immediate complications. Estimated blood loss:                            Minimal. Estimated Blood Loss:     Estimated blood loss was minimal. Impression:               - One 4 mm polyp in the transverse colon, removed                            with a cold snare. Resected and retrieved.                           - One 4 to 5 mm polyp in the sigmoid colon, removed                            with a cold snare. Resected and retrieved.                           - Internal hemorrhoids.                           - The  examination was otherwise normal. Recommendation:           - Patient has a contact number available for                            emergencies. The signs and symptoms of potential                            delayed complications were discussed with the                            patient. Return to normal activities tomorrow.                            Written discharge instructions were provided to the                            patient.                           - Resume previous  diet.                           - Continue present medications.                           - Await pathology results. Remo Lipps P. Armbruster, MD 04/15/2019 4:30:36 PM This report has been signed electronically.

## 2019-04-15 NOTE — Progress Notes (Signed)
Called to room to assist during endoscopic procedure.  Patient ID and intended procedure confirmed with present staff. Received instructions for my participation in the procedure from the performing physician.  

## 2019-04-17 ENCOUNTER — Telehealth: Payer: Self-pay

## 2019-04-17 NOTE — Telephone Encounter (Signed)
  Follow up Call-  Call back number 04/15/2019  Post procedure Call Back phone  # [phone]  Permission to leave phone message Yes  Some recent data might be hidden     Left message

## 2019-04-17 NOTE — Telephone Encounter (Signed)
Second post procedure follow up call, no answer 

## 2019-04-23 ENCOUNTER — Encounter: Payer: Self-pay | Admitting: Gastroenterology

## 2019-05-02 ENCOUNTER — Encounter: Payer: Self-pay | Admitting: Family Medicine

## 2019-05-02 ENCOUNTER — Ambulatory Visit: Payer: BC Managed Care – PPO | Admitting: Family Medicine

## 2019-05-02 ENCOUNTER — Other Ambulatory Visit: Payer: Self-pay

## 2019-05-02 VITALS — BP 128/84 | HR 76 | Temp 98.2°F | Wt 190.4 lb

## 2019-05-02 DIAGNOSIS — I10 Essential (primary) hypertension: Secondary | ICD-10-CM | POA: Diagnosis not present

## 2019-05-02 DIAGNOSIS — F439 Reaction to severe stress, unspecified: Secondary | ICD-10-CM | POA: Diagnosis not present

## 2019-05-02 MED ORDER — AMLODIPINE BESYLATE 2.5 MG PO TABS: 2.5000 mg | ORAL_TABLET | Freq: Every day | ORAL | 1 refills | Status: DC

## 2019-05-02 NOTE — Patient Instructions (Addendum)
  Continue amlodipine once per day.  Hydroxyzine if needed for sleep. Keep up the good work with stress management techniques, and exercise.   Return to the clinic or go to the nearest emergency room if any of your symptoms worsen or new symptoms occur.   If you have lab work done today you will be contacted with your lab results within the next 2 weeks.  If you have not heard from [place] then please contact us. The fastest way to get your results is to register for My Chart.   IF you received an x-ray today, you will receive an invoice from [facility]. Please contact [facility] at [phone] with questions or concerns regarding your invoice.   IF you received labwork today, you will receive an invoice from [place]. Please contact [facility] at [phone] with questions or concerns regarding your invoice.   Our billing staff will not be able to assist you with questions regarding bills from these companies.  You will be contacted with the lab results as soon as they are available. The fastest way to get your results is to activate your My Chart account. Instructions are located on the last page of this paperwork. If you have not heard from [place] regarding the results in 2 weeks, please contact this office.

## 2019-05-02 NOTE — Progress Notes (Signed)
Subjective:    Patient ID: Dillon Reeves, male    DOB: 28-May-1961, 58 y.o.   MRN: [id number]  HPI Dillon Reeves is a 58 y.o. male Presents today for: Chief Complaint  Patient presents with  . Follow-up     3 week f/u on blood pressure and stress level   . Hypertension: BP Readings from Last 3 Encounters:  05/02/19 128/84  04/15/19 91/60  04/11/19 (!) 143/89   Lab Results  Component Value Date   CREATININE 1.39 (H) 03/28/2019  Borderline on October 9, borderline low when measured on 13th, stable today.  Option of 2.5 mg amlodipine if elevated readings at home. Home readings elevated in 150's/90's - started amlodipine after last visit.  Cr 1.44 in 2019, 1.39 on 9/25. GFR 65.   Situational stress: See last office visit.  Counseling recommended, hydroxyzine as needed for breakthrough symptoms.  Coping techniques and stress management handout given. Schedule improved at work, stress improved. Has not called counselor.  Exercising more, sleeping better, not allowing self to be as stressed.  Took hydroxyzine few times only - has helped.    Patient Active Problem List   Diagnosis Date Noted  . Acute left-sided low back pain with left-sided sciatica 10/30/2016  . 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 Tuberculosis 2018   Past Medical History:  Diagnosis Date  . Allergy   . Anxiety   . GERD (gastroesophageal reflux disease)    takes Zantac prn  . Hypertension   . Metacarpal bone fracture    5th finger  . 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 LUMBAR LAMINECTOMY/DECOMPRESSION MICRODISCECTOMY N/A 03/20/2018   Procedure: LEFT LUMBAR 5- SACRUM 1 MICRODISECTOMY;  Surgeon: Phylliss Bob, MD;  Location: [place];  Service: Orthopedics;  Laterality: N/A;  . OPEN REDUCTION INTERNAL FIXATION (ORIF) HAND Right 01/29/2015   Procedure: OPEN TREATMENT RIGHT  FIFTH METACARPAL FRACTURE;  Surgeon: Milly Jakob, MD;  Location: [place];  Service: Orthopedics;  Laterality: Right;   Allergies  Allergen Reactions  . Fish-Derived Products Anaphylaxis    All [employer].  . Other Anaphylaxis  . Peanut-Containing Drug Products Anaphylaxis   Prior to Admission medications   Medication Sig Start Date End Date Taking? Authorizing Provider  amLODipine (NORVASC) 2.5 MG tablet Take 1 tablet (2.5 mg total) by mouth daily. 04/11/19  Yes Wendie Agreste, MD  hydrOXYzine (ATARAX/VISTARIL) 25 MG tablet Take 0.5-1 tablets (12.5-25 mg total) by mouth 3 (three) times daily as needed for anxiety (or sleep.). 04/11/19  Yes Wendie Agreste, MD  loratadine (CLARITIN) 10 MG tablet Take 10 mg by mouth 1 day or 1 dose.   Yes [provider]  omeprazole (PRILOSEC) 20 MG capsule Take 1 capsule (20 mg total) by mouth 2 (two) times daily before a meal. 04/04/19  Yes Armbruster, Carlota Raspberry, MD   Social History   Socioeconomic History  . Marital status: Married    Spouse name: separated  . Number of children: 3  . Years of education: 2+ years college  . Highest education level: Not on file  Occupational History  . Occupation: delivery truck Education administrator: [employer]  . Occupation: bus Education administrator: [employer]  . Financial resource strain: Not on file  . Food insecurity    Worry:  Not on file    Inability: Not on file  . Transportation needs    Medical: Not on file    Non-medical: Not on file  Tobacco Use  . Smoking status: Never Smoker  . Smokeless tobacco: Never Used  Substance and Sexual Activity  . Alcohol use: Yes    Comment: once or twice every two weeks varies  . Drug use: No  . Sexual activity: Yes  Lifestyle  . Physical activity    Days per week: Not on file    Minutes per session: Not on file  . Stress: Not on file  Relationships  . Social Herbalist on phone: Not on file     Gets together: Not on file    Attends religious service: Not on file    Active member of club or organization: Not on file    Attends meetings of clubs or organizations: Not on file    Relationship status: Not on file  . Intimate partner violence    Fear of current or ex partner: Not on file    Emotionally abused: Not on file    Physically abused: Not on file    Forced sexual activity: Not on file  Other Topics Concern  . Not on file  Social History Narrative   Lives with his wife and their children.   Separating from wife (08/2016).    Review of Systems  Constitutional: Negative for fatigue and unexpected weight change.  Eyes: Negative for visual disturbance.  Respiratory: Negative for cough, chest tightness and shortness of breath.   Cardiovascular: Negative for chest pain, palpitations and leg swelling.  Gastrointestinal: Negative for abdominal pain and blood in stool.  Neurological: Negative for dizziness, light-headedness and headaches.       Objective:   Physical Exam Vitals signs reviewed.  Constitutional:      Appearance: He is well-developed.  HENT:     Head: Normocephalic and atraumatic.  Eyes:     Pupils: Pupils are equal, round, and reactive to light.  Neck:     Vascular: No carotid bruit or JVD.  Cardiovascular:     Rate and Rhythm: Normal rate and regular rhythm.     Heart sounds: Normal heart sounds. No murmur.  Pulmonary:     Effort: Pulmonary effort is normal.     Breath sounds: Normal breath sounds. No rales.  Skin:    General: Skin is warm and dry.  Neurological:     Mental Status: He is alert and oriented to person, place, and time.    Vitals:   05/02/19 0859  BP: 128/84  Pulse: 76  Temp: 98.2 F (36.8 C)  TempSrc: Oral  SpO2: 98%  Weight: 190 lb 6.4 oz (86.4 kg)          Assessment & Plan:   Dillon Reeves is a 58 y.o. male Situational stress  -Improved, continue stress management techniques, exercise.  Essential  hypertension - Plan: amLODipine (NORVASC) 2.5 MG tablet  -Stable on amlodipine.  Continue same dose, recheck 3 months.  Borderline elevated creatinine but normal GFR.  Consider repeat testing next visit  Meds ordered this encounter  Medications  . amLODipine (NORVASC) 2.5 MG tablet    Sig: Take 1 tablet (2.5 mg total) by mouth daily.    Dispense:  90 tablet    Refill:  1   Patient Instructions    Continue amlodipine once per day.  Hydroxyzine if needed for sleep. Keep up the good  work with stress management techniques, and exercise.   Return to the clinic or go to the nearest emergency room if any of your symptoms worsen or new symptoms occur.   If you have lab work done today you will be contacted with your lab results within the next 2 weeks.  If you have not heard from [place] then please contact us. The fastest way to get your results is to register for My Chart.   IF you received an x-ray today, you will receive an invoice from [facility]. Please contact [facility] at [phone] with questions or concerns regarding your invoice.   IF you received labwork today, you will receive an invoice from [place]. Please contact [facility] at [phone] with questions or concerns regarding your invoice.   Our billing staff will not be able to assist you with questions regarding bills from these companies.  You will be contacted with the lab results as soon as they are available. The fastest way to get your results is to activate your My Chart account. Instructions are located on the last page of this paperwork. If you have not heard from [place] regarding the results in 2 weeks, please contact this office.       Signed,   Merri Ray, MD [facility] [place].  05/02/19 9:40 AM

## 2019-07-24 ENCOUNTER — Ambulatory Visit: Payer: BC Managed Care – PPO | Admitting: Adult Health Nurse Practitioner

## 2019-07-24 ENCOUNTER — Encounter: Payer: Self-pay | Admitting: Adult Health Nurse Practitioner

## 2019-07-24 ENCOUNTER — Other Ambulatory Visit: Payer: Self-pay

## 2019-07-24 DIAGNOSIS — I10 Essential (primary) hypertension: Secondary | ICD-10-CM

## 2019-07-24 MED ORDER — AMLODIPINE BESYLATE 5 MG PO TABS: ORAL_TABLET | ORAL | 1 refills | Status: DC

## 2019-07-24 MED ORDER — HYDROCHLOROTHIAZIDE 25 MG PO TABS: 25.0000 mg | ORAL_TABLET | Freq: Every day | ORAL | 3 refills | Status: DC

## 2019-07-24 NOTE — Patient Instructions (Signed)
° ° ° °  If you have lab work done today you will be contacted with your lab results within the next 2 weeks.  If you have not heard from us then please contact us. The fastest way to get your results is to register for My Chart. ° ° °IF you received an x-ray today, you will receive an invoice from [facility]. Please contact [place] [facility] at [phone] with questions or concerns regarding your invoice.  ° °IF you received labwork today, you will receive an invoice from [facility]. Please contact [facility] at [phone] with questions or concerns regarding your invoice.  ° °Our billing staff will not be able to assist you with questions regarding bills from these companies. ° °You will be contacted with the lab results as soon as they are available. The fastest way to get your results is to activate your My Chart account. Instructions are located on the last page of this paperwork. If you have not heard from us regarding the results in 2 weeks, please contact this office. °  ° ° ° °

## 2019-07-24 NOTE — Progress Notes (Signed)
Subjective:  Dillon Reeves is a 59 y.o. male with hypertension.  Previously, has been on Amlodipine 2.5mg .  BP is not in control-cannot pass DOT physical until BP controlled.  Was on ACE/ARBs in the past and developed dry cough.   Current Outpatient Medications  Medication Sig Dispense Refill  . loratadine (CLARITIN) 10 MG tablet Take 10 mg by mouth 1 day or 1 dose.    Marland Kitchen amLODipine (NORVASC) 5 MG tablet 2 tabs qday 60 tablet 1  . hydrochlorothiazide (HYDRODIURIL) 25 MG tablet Take 1 tablet (25 mg total) by mouth daily. 30 tablet 3  . hydrOXYzine (ATARAX/VISTARIL) 25 MG tablet Take 0.5-1 tablets (12.5-25 mg total) by mouth 3 (three) times daily as needed for anxiety (or sleep.). (Patient not taking: Reported on 07/24/2019) 30 tablet 0  . omeprazole (PRILOSEC) 20 MG capsule Take 1 capsule (20 mg total) by mouth 2 (two) times daily before a meal. (Patient not taking: Reported on 07/24/2019) 60 capsule 5   No current facility-administered medications for this visit.    Hypertension ROS: taking medications as instructed, no medication side effects noted, no TIA's, no chest pain on exertion, no dyspnea on exertion and no swelling of ankles.  New concerns: uncontrolled BP.   Objective:  BP (!) 150/100 (Cuff Size: Large)   Pulse 74   Temp 98.4 F (36.9 C) (Temporal)   Ht 5\' 11"  (1.803 m)   Wt 195 lb 6.4 oz (88.6 kg)   SpO2 96%   BMI 27.25 kg/m   Appearance alert, well appearing, and in no distress, oriented to person, place, and time, acyanotic, in no respiratory distress and anxious. General exam BP noted to be severely elevated today in office, S1, S2 normal, no gallop, no murmur, chest clear, no JVD, no HSM, no edema.  Lab review: labs reviewed, I note that renal functions normal, abnormal Creatinine. .   Assessment:   Hypertension poorly controlled, needs further observation and needs improvement.   Plan:  Recommended sodium restriction. Reviewed medications and side effects in  detail. Reviewed potential future medication changes and side effects. The following changes are to be made: Increase Amlodipine to 10mg  daily and add HCTZ 25mg .  Follow up: 5 days and as needed.. On Tuesday, will repeat CMP.  He is inline with this plan. Education given on side effect of potential Hypotension.  Since we are lowering BP quicker, this is a possibility.  He verbalized understanding.   Glyn Ade, NP

## 2019-07-29 ENCOUNTER — Encounter: Payer: Self-pay | Admitting: Adult Health Nurse Practitioner

## 2019-07-29 ENCOUNTER — Ambulatory Visit: Payer: BC Managed Care – PPO | Admitting: Adult Health Nurse Practitioner

## 2019-07-29 ENCOUNTER — Other Ambulatory Visit: Payer: Self-pay

## 2019-07-29 VITALS — BP 111/72 | HR 87 | Temp 97.8°F | Ht 71.0 in | Wt 187.6 lb

## 2019-07-29 DIAGNOSIS — I10 Essential (primary) hypertension: Secondary | ICD-10-CM

## 2019-07-29 MED ORDER — CLONIDINE HCL 0.1 MG PO TABS: ORAL_TABLET | ORAL | 3 refills | Status: DC

## 2019-07-29 NOTE — Progress Notes (Signed)
Subjective:    Dillon Reeves is here for follow-up of his hypertension. His high blood pressure was first noted in 03/2019.Marland Kitchen Home blood pressure readings: range 140s /100s to 160s/100s. Salt intake and diet: salty foods: fast food choice often due to work and lifestyle and salt not added to cooking. Associated signs and symptoms: none. Patient denies: chest pain, dyspnea and palpitations. Use of agents associated with hypertension: none. Medication compliance: taking as prescribed.  The following portions of the patient's history were reviewed and updated as appropriate: past family history, past social history and past surgical history.   Review of Systems A comprehensive review of systems was negative.     Objective:    Physical Exam: BP (!) 146/103 (BP Location: Right Arm, Patient Position: Sitting, Cuff Size: Large)   Pulse 89   Temp 97.8 F (36.6 C) (Temporal)   Ht '5\' 11"'$  (1.803 m)   Wt 187 lb 9.6 oz (85.1 kg)   SpO2 99%   BMI 26.16 kg/m  General appearance: alert and cooperative Lungs: clear to auscultation bilaterally Chest wall: no tenderness Heart: regular rate and rhythm, S1, S2 normal, no murmur, click, rub or gallop Extremities: extremities normal, atraumatic, no cyanosis or edema  Lab Review  not applicable     Assessment:    Hypertension, stage 2 . Evidence of target organ damage: none.    Plan:    Medication: begin Clonidine 0.'1mg'$  1-2 tabs prn for BP >117B systolic or >567O diastolic. Dietary sodium restriction. Check blood pressures daily and record.    He is going to return for a BP check this afternoon within an hour of taking Clonidine 0.'1mg'$  to see if it lowers BP.  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   Orders Placed This Encounter  Procedures  . CMP14+EGFR  . Lipid panel

## 2019-07-29 NOTE — Patient Instructions (Signed)
° ° ° °  If you have lab work done today you will be contacted with your lab results within the next 2 weeks.  If you have not heard from us then please contact us. The fastest way to get your results is to register for My Chart. ° ° °IF you received an x-ray today, you will receive an invoice from [facility]. Please contact [facility] at [phone] with questions or concerns regarding your invoice.  ° °IF you received labwork today, you will receive an invoice from [facility]. Please contact [facility] at [phone] with questions or concerns regarding your invoice.  ° °Our billing staff will not be able to assist you with questions regarding bills from these companies. ° °You will be contacted with the lab results as soon as they are available. The fastest way to get your results is to activate your My Chart account. Instructions are located on the last page of this paperwork. If you have not heard from us regarding the results in 2 weeks, please contact this office. °  ° ° ° °

## 2019-07-30 LAB — CMP14+EGFR
ALT: 15 IU/L (ref 0–44)
AST: 22 IU/L (ref 0–40)
Albumin/Globulin Ratio: 1.7 (ref 1.2–2.2)
Albumin: 5.2 g/dL — ABNORMAL HIGH (ref 3.8–4.9)
Alkaline Phosphatase: 70 IU/L (ref 39–117)
BUN/Creatinine Ratio: 13 (ref 9–20)
BUN: 21 mg/dL (ref 6–24)
Bilirubin Total: 0.4 mg/dL (ref 0.0–1.2)
CO2: 23 mmol/L (ref 20–29)
Calcium: 10.2 mg/dL (ref 8.7–10.2)
Chloride: 93 mmol/L — ABNORMAL LOW (ref 96–106)
Creatinine, Ser: 1.57 mg/dL — ABNORMAL HIGH (ref 0.76–1.27)
GFR calc Af Amer: 56 mL/min/{1.73_m2} — ABNORMAL LOW (ref >59)
GFR calc non Af Amer: 49 mL/min/{1.73_m2} — ABNORMAL LOW (ref >59)
Globulin, Total: 3 g/dL (ref 1.5–4.5)
Glucose: 98 mg/dL (ref 65–99)
Potassium: 4 mmol/L (ref 3.5–5.2)
Sodium: 137 mmol/L (ref 134–144)
Total Protein: 8.2 g/dL (ref 6.0–8.5)

## 2019-07-30 LAB — LIPID PANEL
Chol/HDL Ratio: 3.4 ratio (ref 0.0–5.0)
Cholesterol, Total: 292 mg/dL — ABNORMAL HIGH (ref 100–199)
HDL: 85 mg/dL (ref >39)
LDL Chol Calc (NIH): 193 mg/dL — ABNORMAL HIGH (ref 0–99)
Triglycerides: 87 mg/dL (ref 0–149)
VLDL Cholesterol Cal: 14 mg/dL (ref 5–40)

## 2019-08-15 NOTE — Progress Notes (Signed)
Spoke with pt, understands that he is to follow up in 1 month  and to monitor his bp

## 2019-09-12 ENCOUNTER — Telehealth: Payer: Self-pay

## 2019-09-12 NOTE — Telephone Encounter (Signed)
Left message for patient to call back, has concerns about his allergies and vaccines

## 2019-09-12 NOTE — Telephone Encounter (Signed)
Call pt, lm. Has concerns about covid vaccine and and his allergies

## 2019-09-16 NOTE — Telephone Encounter (Signed)
No action needed

## 2019-09-18 NOTE — Telephone Encounter (Signed)
Spoken to the pt regarding vaccine and concern of allergies. Pt plans to move forward with getting the vaccine

## 2019-09-30 ENCOUNTER — Other Ambulatory Visit: Payer: Self-pay

## 2019-09-30 DIAGNOSIS — K219 Gastro-esophageal reflux disease without esophagitis: Secondary | ICD-10-CM

## 2019-09-30 MED ORDER — OMEPRAZOLE 20 MG PO CPDR: 20.0000 mg | DELAYED_RELEASE_CAPSULE | Freq: Two times a day (BID) | ORAL | 5 refills | Status: DC

## 2019-09-30 NOTE — Progress Notes (Signed)
rec'd fax request for refill of omeprazole 20mg  bid. Patient last seen 04-2019. Script sent

## 2019-10-15 ENCOUNTER — Telehealth: Payer: Self-pay | Admitting: Pediatrics

## 2019-10-15 ENCOUNTER — Other Ambulatory Visit: Payer: Self-pay | Admitting: Family Medicine

## 2019-10-15 MED ORDER — AMLODIPINE BESYLATE 5 MG PO TABS: ORAL_TABLET | ORAL | 1 refills | Status: DC

## 2019-10-15 MED ORDER — AMLODIPINE BESYLATE 10 MG PO TABS: 10.0000 mg | ORAL_TABLET | Freq: Every day | ORAL | 1 refills | Status: DC

## 2019-10-15 NOTE — Telephone Encounter (Signed)
I have called pt to clarify what dose of medication he has been taking. Pt reports that he has been out of the 5 mg of Amlodipine for a while and was taking 4 tabs of 2.5 mg. Pt is running low and requesting a 10 mg tab. I saw that he has been taking the 2 5 mg tabs and it was sent in today. I have resent in the 10 mg of the Amlodipine medication so the pt doesn't have to take that many pills at once.   Pt stated understanding and call pharmacy in a bit to ensure that they have his medication.

## 2019-10-15 NOTE — Telephone Encounter (Signed)
Requested Prescriptions  Pending Prescriptions Disp Refills  . amLODipine (NORVASC) 5 MG tablet 60 tablet 1    Sig: 2 tabs qday     Cardiovascular:  Calcium Channel Blockers Passed - 10/15/2019 12:23 PM      Passed - Last BP in normal range    BP Readings from Last 1 Encounters:  07/29/19 111/72         Passed - Valid encounter within last 6 months    Recent Outpatient Visits          2 months ago Essential hypertension   [facility] [place], NP   2 months ago Essential hypertension   [facility] at [facility], Lorelee Market, NP   5 months ago Situational stress   [facility] [place], MD   6 months ago Essential hypertension   [facility] [place], MD   6 months ago GERD with esophagitis   [facility] [place], Ranell Patrick, MD

## 2019-10-15 NOTE — Telephone Encounter (Signed)
Per insurance coverage/pharmacy would like approval to switch the Pts RX for amLODipine (NORVASC) 5 MG tablet To 10mg  tab instead of him taking two 5mg  tabs/ please advise

## 2019-10-15 NOTE — Telephone Encounter (Signed)
amLODipine (NORVASC) 5 MG tablet  This pt was seen by Felton Clinton last visit prescribed on 1/21 an increase in Norvasc. This has worked really well. He is wanting more refills as he is pleased. He is stable around 120/80 every day. He wants to stay at this increase with additional refills.  [facility] [id number], [place] Phone:  [phone]  Fax:  [phone]

## 2019-11-10 NOTE — Telephone Encounter (Signed)
No action needed, closing encounter

## 2020-02-10 ENCOUNTER — Other Ambulatory Visit: Payer: BC Managed Care – PPO

## 2020-02-24 ENCOUNTER — Other Ambulatory Visit: Payer: Self-pay

## 2020-04-05 ENCOUNTER — Other Ambulatory Visit: Payer: Self-pay | Admitting: Family Medicine

## 2020-04-05 DIAGNOSIS — K219 Gastro-esophageal reflux disease without esophagitis: Secondary | ICD-10-CM

## 2020-04-05 MED ORDER — OMEPRAZOLE 20 MG PO CPDR: 20.0000 mg | DELAYED_RELEASE_CAPSULE | Freq: Two times a day (BID) | ORAL | 5 refills | Status: DC

## 2020-04-05 MED ORDER — AMLODIPINE BESYLATE 10 MG PO TABS: 10.0000 mg | ORAL_TABLET | Freq: Every day | ORAL | 0 refills | Status: DC

## 2020-04-05 NOTE — Telephone Encounter (Signed)
Medication Refill - Medication: amlodipine, omeprazole  Has the patient contacted their pharmacy? Yes.   (Agent: If no, request that the patient contact the pharmacy for the refill.) (Agent: If yes, when and what did the pharmacy advise?)  Preferred Pharmacy (with phone number or street name):  [facility] [place], [address]  [place] [address]  Phone: [phone] Fax: [phone]  Hours: Open 24 hours     Agent: Please be advised that RX refills may take up to 3 business days. We ask that you follow-up with your pharmacy.

## 2020-04-05 NOTE — Telephone Encounter (Signed)
Requested medication (s) are due for refill today: yes  Requested medication (s) are on the active medication list: yes  Last refill:  amlodipine: 10/15/19   Omeprazole:09/30/19  Future visit scheduled: no  Notes to clinic:  needs appt   Requested Prescriptions  Pending Prescriptions Disp Refills   amLODipine (NORVASC) 10 MG tablet 90 tablet 1    Sig: Take 1 tablet (10 mg total) by mouth daily.      Cardiovascular:  Calcium Channel Blockers Failed - 04/05/2020 12:13 PM      Failed - Valid encounter within last 6 months    Recent Outpatient Visits           8 months ago Essential hypertension   [facility] at [facility], Lorelee Market, NP   8 months ago Essential hypertension   [facility] Care at [facility], Lorelee Market, NP   11 months ago Situational stress   [facility] [place], Ranell Patrick, MD   12 months ago Essential hypertension   [facility] [place], Ranell Patrick, MD   1 year ago GERD with esophagitis   [facility] [place], MD              Passed - Last BP in normal range    BP Readings from Last 1 Encounters:  07/29/19 111/72            omeprazole (PRILOSEC) 20 MG capsule 60 capsule 5    Sig: Take 1 capsule (20 mg total) by mouth 2 (two) times daily before a meal.      Gastroenterology: Proton Pump Inhibitors Passed - 04/05/2020 12:13 PM      Passed - Valid encounter within last 12 months    Recent Outpatient Visits           8 months ago Essential hypertension   [facility] at [facility], Lorelee Market, NP   8 months ago Essential hypertension   [facility] at [facility], Lorelee Market, NP   11 months ago Situational stress   [facility] [place] [facility], Ranell Patrick, MD   12 months ago Essential hypertension   [facility] [place] [facility], Ranell Patrick, MD   1 year ago GERD with esophagitis   [facility] [place] [facility], Ranell Patrick, MD

## 2020-05-04 IMAGING — CR DG LUMBAR SPINE 2-3V
2 series · 2 of 2 positions shown · non-contrast
Comparison: Lumbar MRI 03/13/2018.  Radiographs 10/30/2016.

CLINICAL DATA: L5-S1 microdiscectomy.

EXAM:
LUMBAR SPINE - 2-3 VIEW

[lateral]
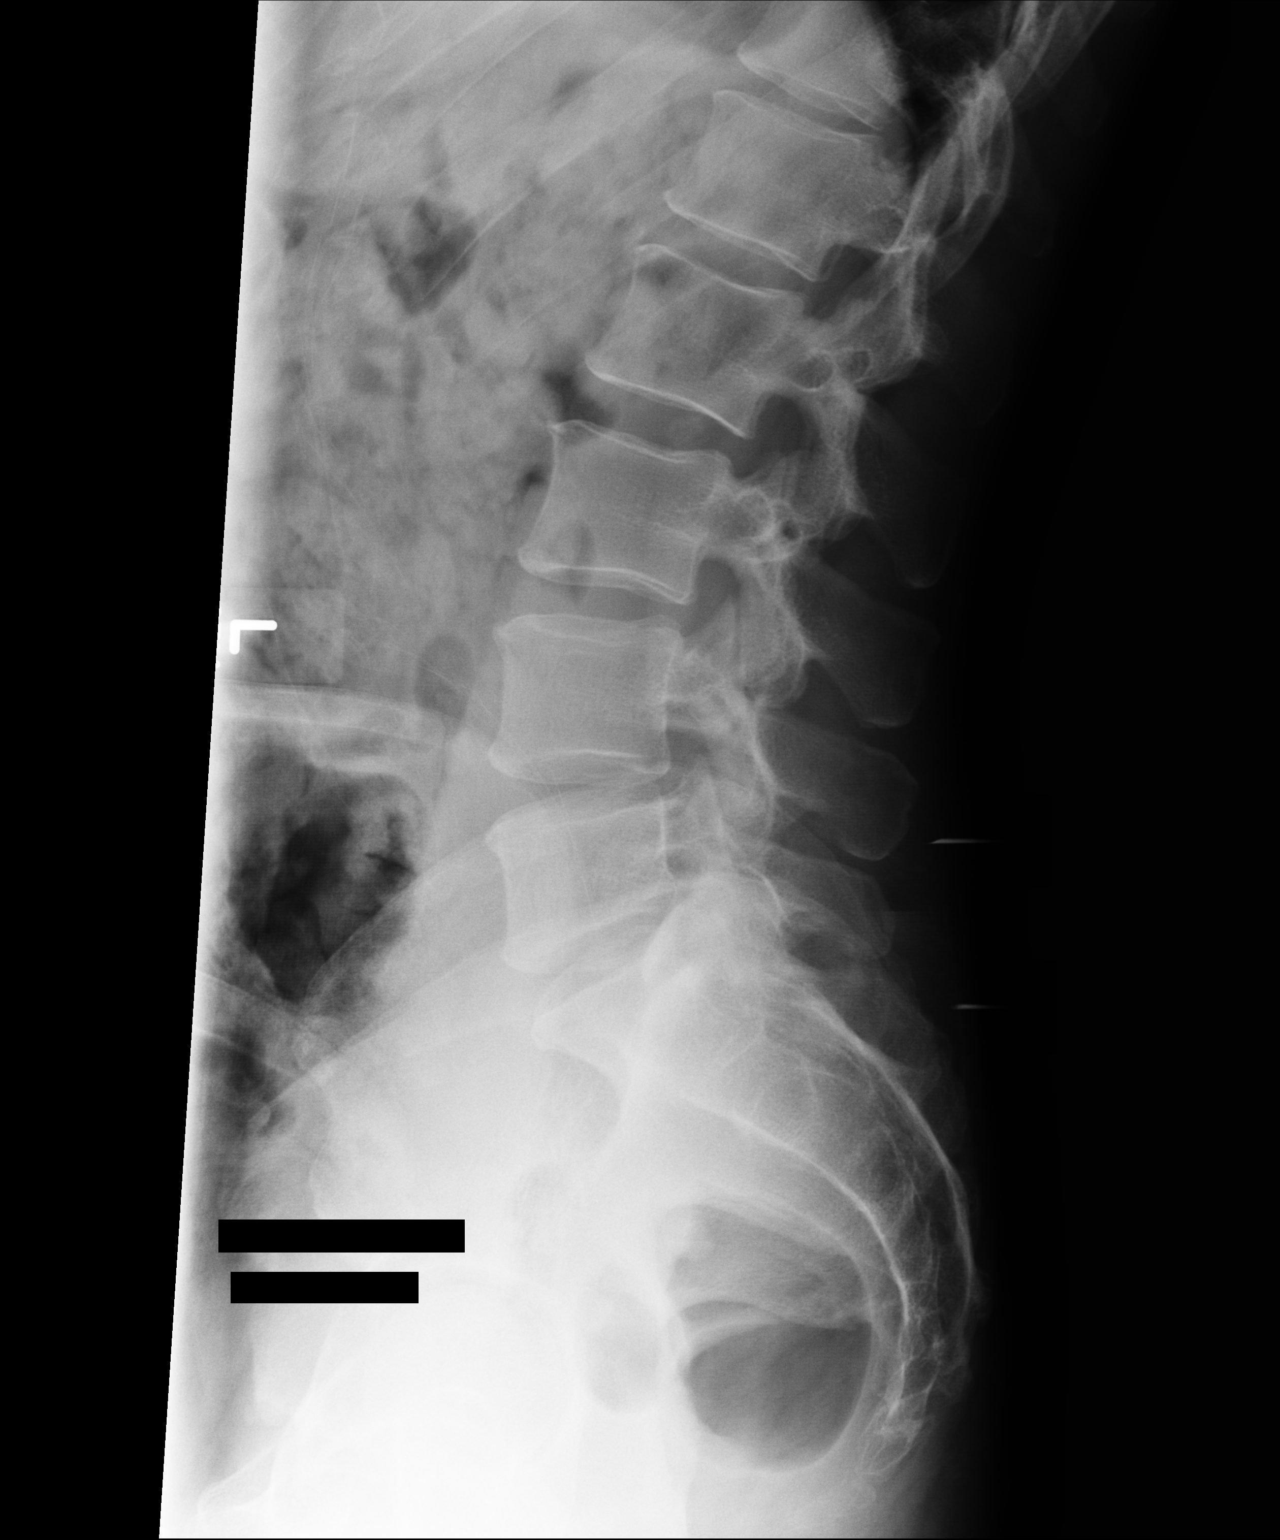

[xtable lateral]
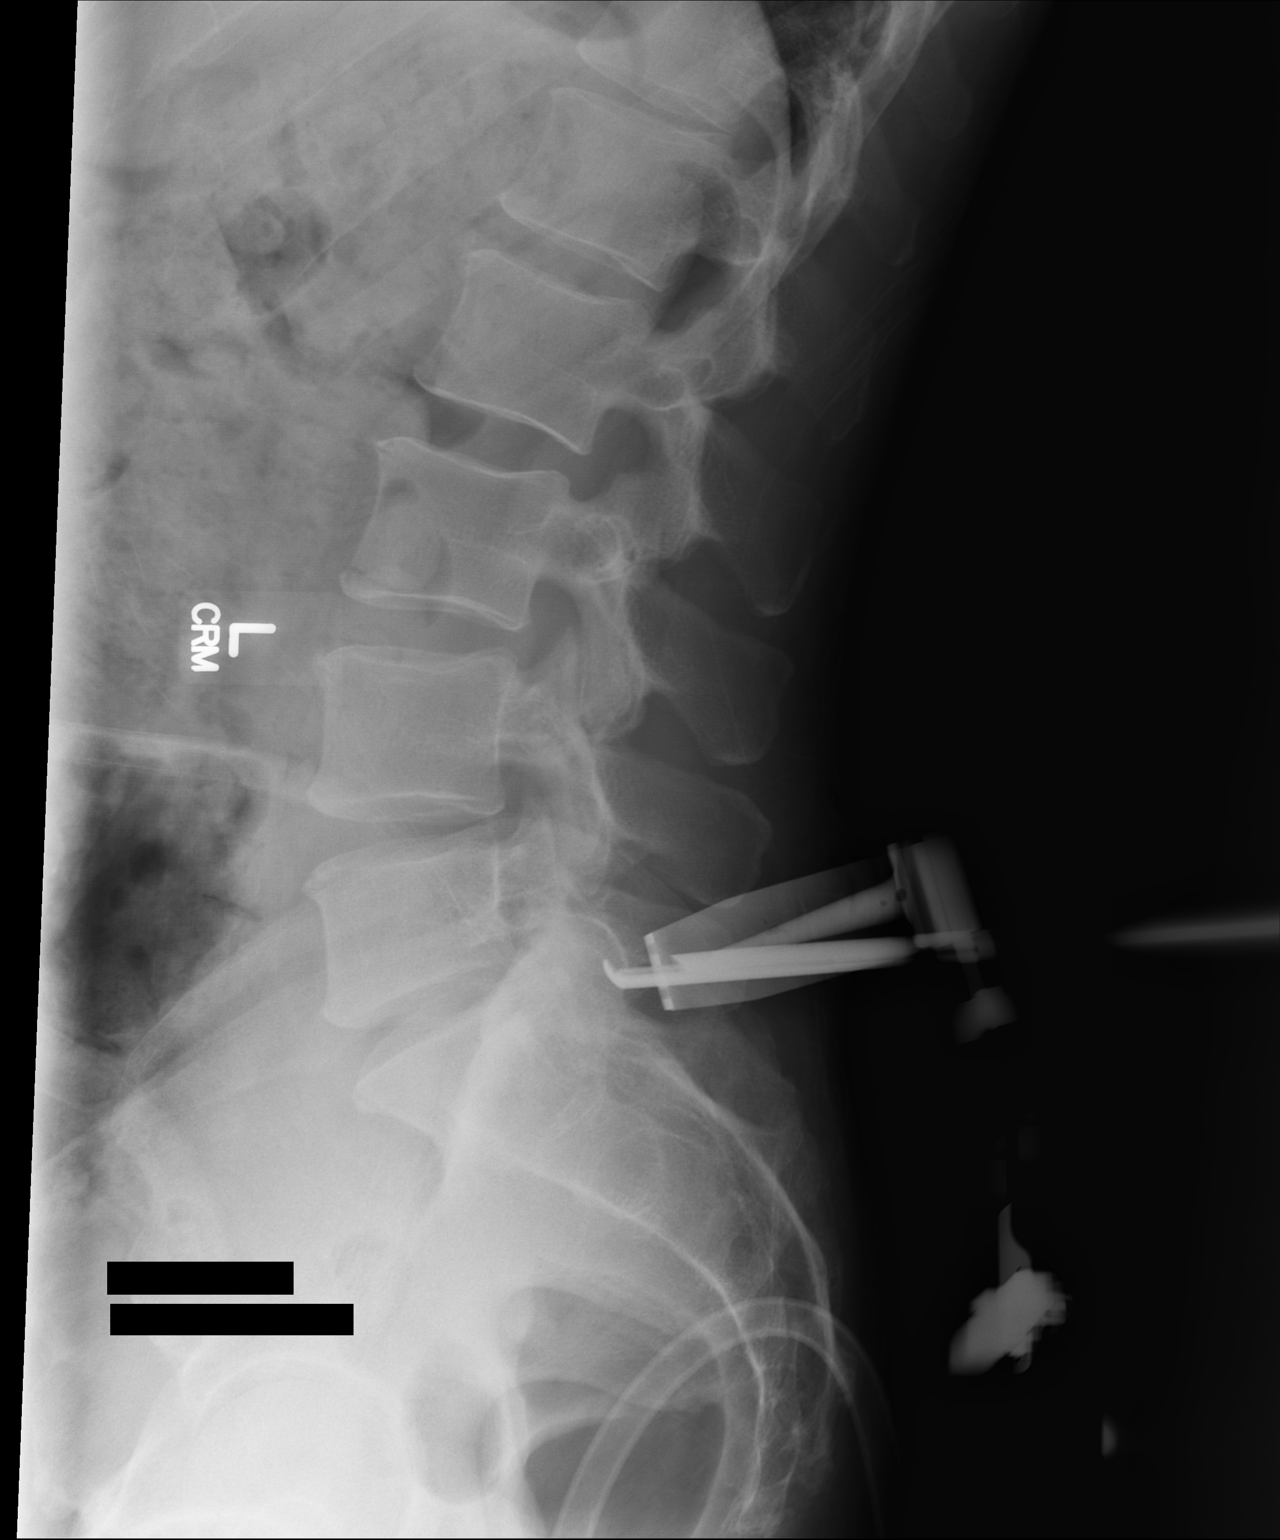

[2 of 2 positions shown; findings below may reference images not displayed]

FINDINGS: Two cross-table lateral views are submitted from the operating room.
The 1st image at 2223 hours demonstrates posterior localizing
needles adjacent to the L4 spinous process and posterior to the
upper sacrum. The 2nd view at 5506 hours demonstrates skin spreaders
posteriorly at the L5-S1 level.
IMPRESSION: Intraoperative localization as described.

## 2020-06-01 ENCOUNTER — Other Ambulatory Visit: Payer: Self-pay | Admitting: Family Medicine

## 2020-06-01 NOTE — Telephone Encounter (Signed)
Requested medication (s) are due for refill today: no  Requested medication (s) are on the active medication list: yes  Last refill: 04/05/2020  Future visit scheduled:no  Notes to clinic:  overdue for office visit Vm left for patient to callback    Requested Prescriptions  Pending Prescriptions Disp Refills   amLODipine (NORVASC) 10 MG tablet [Pharmacy Med Name: AMLODIPINE BESYLATE 10MG  TABLETS] 60 tablet 0    Sig: TAKE 1 TABLET(10 MG) BY MOUTH DAILY      Cardiovascular:  Calcium Channel Blockers Failed - 06/01/2020  7:33 AM      Failed - Valid encounter within last 6 months    Recent Outpatient Visits           10 months ago Essential hypertension   [facility] [place], NP   10 months ago Essential hypertension   [facility] at [facility], Lorelee Market, NP   1 year ago Situational stress   [facility] [place], Ranell Patrick, MD   1 year ago Essential hypertension   [facility] [place] [facility], Ranell Patrick, MD   1 year ago GERD with esophagitis   [facility] [place], MD              Passed - Last BP in normal range    BP Readings from Last 1 Encounters:  07/29/19 111/72

## 2020-08-01 ENCOUNTER — Other Ambulatory Visit: Payer: Self-pay | Admitting: Family Medicine

## 2020-08-01 NOTE — Telephone Encounter (Signed)
Requested medications are due for refill today.  yes  Requested medications are on the active medications list.  yes  Last refill. 06/01/2020  Future visit scheduled.   no  Notes to clinic.  Courtesy refill already given - pt needs appt.

## 2020-10-01 ENCOUNTER — Other Ambulatory Visit: Payer: Self-pay | Admitting: Family Medicine

## 2020-10-17 ENCOUNTER — Other Ambulatory Visit: Payer: Self-pay | Admitting: Family Medicine

## 2020-10-17 DIAGNOSIS — K219 Gastro-esophageal reflux disease without esophagitis: Secondary | ICD-10-CM

## 2020-10-17 NOTE — Telephone Encounter (Signed)
Forwarded to PCP.

## 2020-11-05 ENCOUNTER — Other Ambulatory Visit: Payer: Self-pay | Admitting: Family Medicine

## 2021-01-28 ENCOUNTER — Other Ambulatory Visit: Payer: Self-pay

## 2021-01-28 ENCOUNTER — Encounter: Payer: Self-pay | Admitting: Registered Nurse

## 2021-01-28 ENCOUNTER — Ambulatory Visit: Payer: BC Managed Care – PPO | Admitting: Registered Nurse

## 2021-01-28 VITALS — BP 127/85 | HR 74 | Temp 98.2°F | Resp 16 | Ht 71.0 in | Wt 189.0 lb

## 2021-01-28 DIAGNOSIS — Z1329 Encounter for screening for other suspected endocrine disorder: Secondary | ICD-10-CM | POA: Diagnosis not present

## 2021-01-28 DIAGNOSIS — Z23 Encounter for immunization: Secondary | ICD-10-CM | POA: Diagnosis not present

## 2021-01-28 DIAGNOSIS — Z13220 Encounter for screening for lipoid disorders: Secondary | ICD-10-CM

## 2021-01-28 DIAGNOSIS — Z13228 Encounter for screening for other metabolic disorders: Secondary | ICD-10-CM

## 2021-01-28 DIAGNOSIS — I10 Essential (primary) hypertension: Secondary | ICD-10-CM | POA: Diagnosis not present

## 2021-01-28 DIAGNOSIS — Z130 Encounter for screening for diseases of the blood and blood-forming organs and certain disorders involving the immune mechanism: Secondary | ICD-10-CM

## 2021-01-28 MED ORDER — HYDROCHLOROTHIAZIDE 25 MG PO TABS: 25.0000 mg | ORAL_TABLET | Freq: Every day | ORAL | 1 refills | Status: DC

## 2021-01-28 MED ORDER — AMLODIPINE BESYLATE 10 MG PO TABS: ORAL_TABLET | ORAL | 1 refills | Status: DC

## 2021-01-28 NOTE — Progress Notes (Signed)
Established Patient Office Visit  Subjective:  Patient ID: Dillon Reeves, male    DOB: 1961/05/26  Age: 60 y.o. MRN: [id number]  CC:  Chief Complaint  Patient presents with   Medication Refill    Patient states he is here for medication refill and discuss shingles.    HPI Dillon Reeves presents for medication refill  Hypertension: Patient Currently taking: Hctz '25mg'$  PO qd, amlodipine '10mg'$  Po qd, clonidine 0.'1mg'$  po once or twice daily for elevated bp Good effect. No AEs. Denies CV symptoms including: chest pain, shob, doe, headache, visual changes, fatigue, claudication, and dependent edema.   Previous readings and labs: BP Readings from Last 3 Encounters:  01/28/21 127/85  07/29/19 111/72  07/24/19 (!) 150/100   Lab Results  Component Value Date   CREATININE 1.57 (H) 07/29/2019    Shingles Interested in vaccine today No hx of bad reactions to vaccine Hx of chicken pox No hx of shingles.    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, asymptomatic    Past Surgical History:  Procedure Laterality Date   LUMBAR LAMINECTOMY/DECOMPRESSION MICRODISCECTOMY N/A 03/20/2018   Procedure: LEFT LUMBAR 5- SACRUM 1 MICRODISECTOMY;  Surgeon: Phylliss Bob, MD;  Location: [facility];  Service: Orthopedics;  Laterality: N/A;   OPEN REDUCTION INTERNAL FIXATION (ORIF) HAND Right 01/29/2015   Procedure: OPEN TREATMENT RIGHT FIFTH METACARPAL FRACTURE;  Surgeon: Milly Jakob, MD;  Location: [facility];  Service: Orthopedics;  Laterality: Right;    Family History  Problem Relation Age of Onset   Heart Problems Mother    Anemia Mother    Hypertension Brother    Colon cancer Neg Hx    Esophageal cancer Neg Hx    Stomach  cancer Neg Hx    Rectal cancer Neg Hx     Social History   Socioeconomic History   Marital status: Married    Spouse name: separated   Number of children: 3   Years of education: 2+ years college   Highest education level: Not on file  Occupational History   Occupation: delivery truck Education administrator: [employer]   Occupation: bus Education administrator: [place]  Tobacco Use   Smoking status: Never   Smokeless tobacco: Never  Vaping Use   Vaping Use: Never used  Substance and Sexual Activity   Alcohol use: Yes    Comment: once or twice every two weeks varies   Drug use: No   Sexual activity: Yes  Other Topics Concern   Not on file  Social History Narrative   Lives with his wife and their children.   Separating from wife (08/2016).   Social Determinants of Health   Financial Resource Strain: Not on file  Food Insecurity: Not on file  Transportation Needs: Not on file  Physical Activity: Not on file  Stress: Not on file  Social Connections: Not on file  Intimate Partner Violence: Not on file    Outpatient Medications Prior to Visit  Medication Sig Dispense Refill   cloNIDine (CATAPRES) 0.1 MG tablet One to two tabs daily  for BP > [id number] systolic or [id number] diastolic 90 tablet  3   hydrOXYzine (ATARAX/VISTARIL) 25 MG tablet Take 0.5-1 tablets (12.5-25 mg total) by mouth 3 (three) times daily as needed for anxiety (or sleep.). 30 tablet 0   loratadine (CLARITIN) 10 MG tablet Take 10 mg by mouth 1 day or 1 dose.     omeprazole (PRILOSEC) 20 MG capsule TAKE 1 CAPSULE(20 MG) BY MOUTH TWICE DAILY BEFORE A MEAL 60 capsule 5   amLODipine (NORVASC) 10 MG tablet TAKE 1 TABLET(10 MG) BY MOUTH DAILY 30 tablet 0   hydrochlorothiazide (HYDRODIURIL) 25 MG tablet Take 1 tablet (25 mg total) by mouth daily. 30 tablet 3   No facility-administered medications prior to visit.    Allergies  Allergen Reactions   Fish-Derived Products Anaphylaxis    All Sea Food.   Other  Anaphylaxis   Peanut-Containing Drug Products Anaphylaxis    ROS Review of Systems  Constitutional: Negative.   HENT: Negative.    Eyes: Negative.   Respiratory: Negative.    Cardiovascular: Negative.   Gastrointestinal: Negative.   Genitourinary: Negative.   Musculoskeletal: Negative.   Skin: Negative.   Neurological: Negative.   Psychiatric/Behavioral: Negative.    All other systems reviewed and are negative.    Objective:    Physical Exam Constitutional:      General: He is not in acute distress.    Appearance: Normal appearance. He is normal weight. He is not ill-appearing, toxic-appearing or diaphoretic.  Cardiovascular:     Rate and Rhythm: Normal rate and regular rhythm.     Heart sounds: Normal heart sounds. No murmur heard.   No friction rub. No gallop.  Pulmonary:     Effort: Pulmonary effort is normal. No respiratory distress.     Breath sounds: Normal breath sounds. No stridor. No wheezing, rhonchi or rales.  Chest:     Chest wall: No tenderness.  Neurological:     General: No focal deficit present.     Mental Status: He is alert and oriented to person, place, and time. Mental status is at baseline.  Psychiatric:        Mood and Affect: Mood normal.        Behavior: Behavior normal.        Thought Content: Thought content normal.        Judgment: Judgment normal.    BP 127/85   Pulse 74   Temp 98.2 F (36.8 C) (Temporal)   Resp 16   Ht '5\' 11"'$  (1.803 m)   Wt 189 lb (85.7 kg)   SpO2 99%   BMI 26.36 kg/m  Wt Readings from Last 3 Encounters:  01/28/21 189 lb (85.7 kg)  07/29/19 187 lb 9.6 oz (85.1 kg)  07/24/19 195 lb 6.4 oz (88.6 kg)     There are no preventive care reminders to display for this patient.   There are no preventive care reminders to display for this patient.  No results found for: TSH Lab Results  Component Value Date   WBC 5.4 03/28/2019   HGB 14.2 03/28/2019   HCT 40.2 03/28/2019   MCV 92 03/28/2019   PLT 351  03/28/2019   Lab Results  Component Value Date   NA 137 07/29/2019   K 4.0 07/29/2019   CO2 23 07/29/2019   GLUCOSE 98 07/29/2019   BUN 21 07/29/2019   CREATININE 1.57 (H) 07/29/2019   BILITOT 0.4 07/29/2019   ALKPHOS 70 07/29/2019   AST 22 07/29/2019   ALT 15 07/29/2019   PROT 8.2 07/29/2019   ALBUMIN  5.2 (H) 07/29/2019   CALCIUM 10.2 07/29/2019   ANIONGAP 9 03/20/2018   Lab Results  Component Value Date   CHOL 292 (H) 07/29/2019   Lab Results  Component Value Date   HDL 85 07/29/2019   Lab Results  Component Value Date   LDLCALC 193 (H) 07/29/2019   Lab Results  Component Value Date   TRIG 87 07/29/2019   Lab Results  Component Value Date   CHOLHDL 3.4 07/29/2019   No results found for: HGBA1C    Assessment & Plan:   Problem List Items Addressed This Visit       Cardiovascular and Mediastinum   Hypertension - Primary (Chronic)   Relevant Medications   hydrochlorothiazide (HYDRODIURIL) 25 MG tablet   amLODipine (NORVASC) 10 MG tablet   Other Visit Diagnoses     Screening for endocrine, metabolic and immunity disorder       Relevant Orders   CBC with Differential/Platelet   Hemoglobin A1c   Comprehensive metabolic panel   TSH   Lipid screening       Relevant Orders   Lipid panel   Need for shingles vaccine       Relevant Orders   Varicella-zoster vaccine IM (Shingrix) (Completed)       Meds ordered this encounter  Medications   hydrochlorothiazide (HYDRODIURIL) 25 MG tablet    Sig: Take 1 tablet (25 mg total) by mouth daily.    Dispense:  90 tablet    Refill:  1    Order Specific Question:   Supervising Provider    Answer:   Carlota Raspberry, JEFFREY R [[id number]]   amLODipine (NORVASC) 10 MG tablet    Sig: TAKE 1 TABLET(10 MG) BY MOUTH DAILY    Dispense:  90 tablet    Refill:  1    Overdue for follow up - needs appt. Temporary rx given.    Order Specific Question:   Supervising Provider    Answer:   Merri Ray R [id number]    Follow-up:  Return in about 6 months (around 07/31/2021) for HTN.   PLAN Meds refilled x 6 mo Labs collected. Will follow up with the patient as warranted. Shingles vaccine dose 1 given Patient encouraged to call clinic with any questions, comments, or concerns.  I spent 34 minutes with this patient discussing physiology, vaccine efficacy, and coordinating future care.  Maximiano Coss, NP

## 2021-01-28 NOTE — Patient Instructions (Signed)
Mr. Abdullahi -   Doristine Devoid to meet you  See you in 6 mo to recheck  Keep up the good work on BP control Labs today will be back tomorrow - you'll get a call if there's urgent concerns, otherwise expect a letter in a week or two.  Let me know if I can do anything for you  Thanks,  Denice Paradise

## 2021-01-29 LAB — HEMOGLOBIN A1C
Hgb A1c MFr Bld: 5.7 % of total Hgb — ABNORMAL HIGH (ref <5.7)
Mean Plasma Glucose: 117 mg/dL
eAG (mmol/L): 6.5 mmol/L

## 2021-01-29 LAB — CBC WITH DIFFERENTIAL/PLATELET
Absolute Monocytes: 408 cells/uL (ref 200–950)
Basophils Absolute: 51 cells/uL (ref 0–200)
Basophils Relative: 1 %
Eosinophils Absolute: 138 cells/uL (ref 15–500)
Eosinophils Relative: 2.7 %
HCT: 40.4 % (ref 38.5–50.0)
Hemoglobin: 14 g/dL (ref 13.2–17.1)
Lymphs Abs: 1765 cells/uL (ref 850–3900)
MCH: 31.8 pg (ref 27.0–33.0)
MCHC: 34.7 g/dL (ref 32.0–36.0)
MCV: 91.8 fL (ref 80.0–100.0)
MPV: 10.1 fL (ref 7.5–12.5)
Monocytes Relative: 8 %
Neutro Abs: 2739 cells/uL (ref 1500–7800)
Neutrophils Relative %: 53.7 %
Platelets: 333 10*3/uL (ref 140–400)
RBC: 4.4 10*6/uL (ref 4.20–5.80)
RDW: 12.8 % (ref 11.0–15.0)
Total Lymphocyte: 34.6 %
WBC: 5.1 10*3/uL (ref 3.8–10.8)

## 2021-01-29 LAB — LIPID PANEL
Cholesterol: 245 mg/dL — ABNORMAL HIGH (ref <200)
HDL: 80 mg/dL (ref >=40)
LDL Cholesterol (Calc): 150 mg/dL (calc) — ABNORMAL HIGH
Non-HDL Cholesterol (Calc): 165 mg/dL (calc) — ABNORMAL HIGH (ref <130)
Total CHOL/HDL Ratio: 3.1 (calc) (ref <5.0)
Triglycerides: 62 mg/dL (ref <150)

## 2021-01-29 LAB — COMPREHENSIVE METABOLIC PANEL
AG Ratio: 1.8 (calc) (ref 1.0–2.5)
ALT: 11 U/L (ref 9–46)
AST: 14 U/L (ref 10–35)
Albumin: 4.2 g/dL (ref 3.6–5.1)
Alkaline phosphatase (APISO): 44 U/L (ref 35–144)
BUN/Creatinine Ratio: 8 (calc) (ref 6–22)
BUN: 13 mg/dL (ref 7–25)
CO2: 26 mmol/L (ref 20–32)
Calcium: 9.5 mg/dL (ref 8.6–10.3)
Chloride: 105 mmol/L (ref 98–110)
Creat: 1.61 mg/dL — ABNORMAL HIGH (ref 0.70–1.30)
Globulin: 2.4 g/dL (calc) (ref 1.9–3.7)
Glucose, Bld: 81 mg/dL (ref 65–99)
Potassium: 4.2 mmol/L (ref 3.5–5.3)
Sodium: 140 mmol/L (ref 135–146)
Total Bilirubin: 0.4 mg/dL (ref 0.2–1.2)
Total Protein: 6.6 g/dL (ref 6.1–8.1)

## 2021-01-29 LAB — TSH: TSH: 1.83 mIU/L (ref 0.40–4.50)

## 2021-01-31 ENCOUNTER — Other Ambulatory Visit: Payer: Self-pay | Admitting: Registered Nurse

## 2021-01-31 ENCOUNTER — Encounter: Payer: Self-pay | Admitting: Registered Nurse

## 2021-01-31 DIAGNOSIS — E782 Mixed hyperlipidemia: Secondary | ICD-10-CM

## 2021-01-31 MED ORDER — ROSUVASTATIN CALCIUM 10 MG PO TABS: 10.0000 mg | ORAL_TABLET | Freq: Every day | ORAL | 3 refills | Status: DC

## 2021-04-21 ENCOUNTER — Other Ambulatory Visit: Payer: Self-pay | Admitting: Family Medicine

## 2021-04-21 DIAGNOSIS — K219 Gastro-esophageal reflux disease without esophagitis: Secondary | ICD-10-CM

## 2021-07-26 ENCOUNTER — Other Ambulatory Visit: Payer: Self-pay | Admitting: Registered Nurse

## 2021-07-26 DIAGNOSIS — I10 Essential (primary) hypertension: Secondary | ICD-10-CM

## 2021-08-02 ENCOUNTER — Other Ambulatory Visit: Payer: Self-pay

## 2021-08-02 ENCOUNTER — Encounter: Payer: Self-pay | Admitting: Registered Nurse

## 2021-08-02 ENCOUNTER — Ambulatory Visit: Payer: BC Managed Care – PPO | Admitting: Registered Nurse

## 2021-08-02 VITALS — BP 135/88 | HR 80 | Temp 98.3°F | Resp 18 | Ht 71.0 in | Wt 192.4 lb

## 2021-08-02 DIAGNOSIS — I10 Essential (primary) hypertension: Secondary | ICD-10-CM

## 2021-08-02 DIAGNOSIS — N529 Male erectile dysfunction, unspecified: Secondary | ICD-10-CM | POA: Diagnosis not present

## 2021-08-02 DIAGNOSIS — F5104 Psychophysiologic insomnia: Secondary | ICD-10-CM

## 2021-08-02 DIAGNOSIS — K219 Gastro-esophageal reflux disease without esophagitis: Secondary | ICD-10-CM

## 2021-08-02 DIAGNOSIS — F411 Generalized anxiety disorder: Secondary | ICD-10-CM

## 2021-08-02 DIAGNOSIS — Z23 Encounter for immunization: Secondary | ICD-10-CM | POA: Diagnosis not present

## 2021-08-02 DIAGNOSIS — F439 Reaction to severe stress, unspecified: Secondary | ICD-10-CM | POA: Diagnosis not present

## 2021-08-02 MED ORDER — SILDENAFIL CITRATE 100 MG PO TABS: 50.0000 mg | ORAL_TABLET | Freq: Every day | ORAL | 11 refills | Status: DC | PRN

## 2021-08-02 MED ORDER — HYDROCHLOROTHIAZIDE 25 MG PO TABS: 25.0000 mg | ORAL_TABLET | Freq: Every day | ORAL | 1 refills | Status: DC

## 2021-08-02 MED ORDER — HYDROXYZINE HCL 25 MG PO TABS: 12.5000 mg | ORAL_TABLET | Freq: Three times a day (TID) | ORAL | 0 refills | Status: DC | PRN

## 2021-08-02 MED ORDER — OMEPRAZOLE 20 MG PO CPDR: 20.0000 mg | DELAYED_RELEASE_CAPSULE | Freq: Every day | ORAL | 3 refills | Status: DC

## 2021-08-02 MED ORDER — AMLODIPINE BESYLATE 10 MG PO TABS: ORAL_TABLET | ORAL | 1 refills | Status: DC

## 2021-08-02 NOTE — Progress Notes (Signed)
Established Patient Office Visit  Subjective:  Patient ID: Dillon Reeves, male    DOB: [birth year]  Age: 61 y.o. MRN: [id number]  CC:  Chief Complaint  Patient presents with   Medication Refill    Patient states he is here for a medication refill and also his second shingles shot. Patient states he would like to talk about the stress from his job and anxiety     HPI Dillon Reeves presents for htn, anxiety, vaccines  Hypertension: Patient Currently taking: hctz 25mg  po qd, amlodipine 10mg  po qd Good effect. Notes AE of ED.  Denies CV symptoms including: chest pain, shob, doe, headache, visual changes, fatigue, claudication, and dependent edema.   Previous readings and labs: BP Readings from Last 3 Encounters:  08/02/21 135/88  01/28/21 127/85  07/29/19 111/72   Lab Results  Component Value Date   CREATININE 1.61 (H) 01/28/2021   ED Worse since starting BP meds Worse with anxiety Trouble achieving and maintaining No pain, hematuria, hematospermia Has not used medication in the past.   Anxiety Mostly about work Has used hydroxyzine 25mg   1/2-1 tab po tid prn with good effect in the past, no AE Would like to continue.  Vaccines Due for shingrix, flu Ok for both today No reactions to vaccines in the past.  Refill Omeprazole Uses most days for GERD No concerns. 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, asymptomatic    Past Surgical History:  Procedure Laterality Date   LUMBAR LAMINECTOMY/DECOMPRESSION MICRODISCECTOMY N/A 03/20/2018   Procedure: LEFT LUMBAR 5- SACRUM 1 MICRODISECTOMY;  Surgeon: Phylliss Bob, MD;  Location: [facility];  Service: Orthopedics;  Laterality: N/A;   OPEN REDUCTION INTERNAL  FIXATION (ORIF) HAND Right 01/29/2015   Procedure: OPEN TREATMENT RIGHT FIFTH METACARPAL FRACTURE;  Surgeon: Milly Jakob, MD;  Location: [facility];  Service: Orthopedics;  Laterality: Right;    Family History  Problem Relation Age of Onset   Heart Problems Mother    Anemia Mother    Hypertension Brother    Colon cancer Neg Hx    Esophageal cancer Neg Hx    Stomach cancer Neg Hx    Rectal cancer Neg Hx     Social History   Socioeconomic History   Marital status: Married    Spouse name: separated   Number of children: 3   Years of education: 2+ years college   Highest education level: Not on file  Occupational History   Occupation: delivery truck Education administrator: [employer]   Occupation: bus Education administrator: [place]  Tobacco Use   Smoking status: Never   Smokeless tobacco: Never  Vaping Use   Vaping Use: Never used  Substance and Sexual Activity   Alcohol use: Yes    Comment: once or twice every two weeks varies   Drug use: No   Sexual activity: Yes  Other Topics Concern   Not on file  Social History Narrative   Lives with his wife and their children.   Separating from wife (08/2016).   Social Determinants of Health   Financial Resource Strain: Not on file  Food Insecurity: Not on file  Transportation Needs: Not on file  Physical Activity: Not on file  Stress: Not on file  Social Connections: Not on file  Intimate Partner Violence: Not on file    Outpatient Medications Prior to Visit  Medication Sig Dispense Refill   cloNIDine (CATAPRES) 0.1 MG tablet One to two tabs daily  for BP > 224 systolic or 825 diastolic 90 tablet 3   loratadine (CLARITIN) 10 MG tablet Take 10 mg by mouth 1 day or 1 dose.     rosuvastatin (CRESTOR) 10 MG tablet Take 1 tablet (10 mg total) by mouth daily. 90 tablet 3   amLODipine (NORVASC) 10 MG tablet TAKE 1 TABLET(10 MG) BY MOUTH DAILY 90 tablet 1   hydrochlorothiazide (HYDRODIURIL) 25 MG  tablet Take 1 tablet (25 mg total) by mouth daily. 90 tablet 1   hydrOXYzine (ATARAX/VISTARIL) 25 MG tablet Take 0.5-1 tablets (12.5-25 mg total) by mouth 3 (three) times daily as needed for anxiety (or sleep.). 30 tablet 0   omeprazole (PRILOSEC) 20 MG capsule TAKE 1 CAPSULE(20 MG) BY MOUTH TWICE DAILY BEFORE A MEAL 60 capsule 5   No facility-administered medications prior to visit.    Allergies  Allergen Reactions   Fish-Derived Products Anaphylaxis    All Sea Food.   Other Anaphylaxis   Peanut-Containing Drug Products Anaphylaxis    ROS Review of Systems  Constitutional: Negative.   HENT: Negative.    Eyes: Negative.   Respiratory: Negative.    Cardiovascular: Negative.   Gastrointestinal: Negative.   Genitourinary: Negative.   Musculoskeletal: Negative.   Skin: Negative.   Neurological: Negative.   Psychiatric/Behavioral: Negative.    All other systems reviewed and are negative.    Objective:    Physical Exam Constitutional:      General: He is not in acute distress.    Appearance: Normal appearance. He is normal weight. He is not ill-appearing, toxic-appearing or diaphoretic.  Cardiovascular:     Rate and Rhythm: Normal rate and regular rhythm.     Heart sounds: Normal heart sounds. No murmur heard.   No friction rub. No gallop.  Pulmonary:     Effort: Pulmonary effort is normal. No respiratory distress.     Breath sounds: Normal breath sounds. No stridor. No wheezing, rhonchi or rales.  Chest:     Chest wall: No tenderness.  Neurological:     General: No focal deficit present.     Mental Status: He is alert and oriented to person, place, and time. Mental status is at baseline.  Psychiatric:        Mood and Affect: Mood normal.        Behavior: Behavior normal.        Thought Content: Thought content normal.        Judgment: Judgment normal.    BP 135/88    Pulse 80    Temp 98.3 F (36.8 C) (Temporal)    Resp 18    Ht 5\' 11"  (1.803 m)    Wt 192 lb 6.4 oz  (87.3 kg)    SpO2 99%    BMI 26.83 kg/m  Wt Readings from Last 3 Encounters:  08/02/21 192 lb 6.4 oz (87.3 kg)  01/28/21 189 lb (85.7 kg)  07/29/19 187 lb 9.6 oz (85.1 kg)     Health Maintenance Due  Topic Date Due   COVID-19 Vaccine (1) Never done   INFLUENZA VACCINE  01/31/2021   Zoster Vaccines- Shingrix (2 of 2) 03/25/2021  There are no preventive care reminders to display for this patient.  Lab Results  Component Value Date   TSH 1.83 01/28/2021   Lab Results  Component Value Date   WBC 5.1 01/28/2021   HGB 14.0 01/28/2021   HCT 40.4 01/28/2021   MCV 91.8 01/28/2021   PLT 333 01/28/2021   Lab Results  Component Value Date   NA 140 01/28/2021   K 4.2 01/28/2021   CO2 26 01/28/2021   GLUCOSE 81 01/28/2021   BUN 13 01/28/2021   CREATININE 1.61 (H) 01/28/2021   BILITOT 0.4 01/28/2021   ALKPHOS 70 07/29/2019   AST 14 01/28/2021   ALT 11 01/28/2021   PROT 6.6 01/28/2021   ALBUMIN 5.2 (H) 07/29/2019   CALCIUM 9.5 01/28/2021   ANIONGAP 9 03/20/2018   Lab Results  Component Value Date   CHOL 245 (H) 01/28/2021   Lab Results  Component Value Date   HDL 80 01/28/2021   Lab Results  Component Value Date   LDLCALC 150 (H) 01/28/2021   Lab Results  Component Value Date   TRIG 62 01/28/2021   Lab Results  Component Value Date   CHOLHDL 3.1 01/28/2021   Lab Results  Component Value Date   HGBA1C 5.7 (H) 01/28/2021      Assessment & Plan:   Problem List Items Addressed This Visit       Cardiovascular and Mediastinum   Hypertension (Chronic)   Relevant Medications   amLODipine (NORVASC) 10 MG tablet   hydrochlorothiazide (HYDRODIURIL) 25 MG tablet   sildenafil (VIAGRA) 100 MG tablet   Other Visit Diagnoses     Need for shingles vaccine    -  Primary   Relevant Orders   Varicella-zoster vaccine IM   Gastroesophageal reflux disease, unspecified whether esophagitis present       Relevant Medications   omeprazole (PRILOSEC) 20 MG capsule    Needs flu shot       Relevant Orders   Flu Vaccine QUAD 6+ mos PF IM (Fluarix Quad PF)   Erectile dysfunction, unspecified erectile dysfunction type       Relevant Medications   sildenafil (VIAGRA) 100 MG tablet   Situational stress       Relevant Medications   hydrOXYzine (ATARAX) 25 MG tablet   Anxiety state       Relevant Medications   hydrOXYzine (ATARAX) 25 MG tablet   Psychophysiological insomnia       Relevant Medications   hydrOXYzine (ATARAX) 25 MG tablet       Meds ordered this encounter  Medications   amLODipine (NORVASC) 10 MG tablet    Sig: TAKE 1 TABLET(10 MG) BY MOUTH DAILY    Dispense:  90 tablet    Refill:  1   omeprazole (PRILOSEC) 20 MG capsule    Sig: Take 1 capsule (20 mg total) by mouth daily.    Dispense:  90 capsule    Refill:  3   hydrochlorothiazide (HYDRODIURIL) 25 MG tablet    Sig: Take 1 tablet (25 mg total) by mouth daily.    Dispense:  90 tablet    Refill:  1   sildenafil (VIAGRA) 100 MG tablet    Sig: Take 0.5-1 tablets (50-100 mg total) by mouth daily as needed for erectile dysfunction.    Dispense:  30 tablet    Refill:  11    Order Specific Question:   Supervising Provider    Answer:   Carlota Raspberry, JEFFREY R [[id number]]   hydrOXYzine (ATARAX)  25 MG tablet    Sig: Take 0.5-1 tablets (12.5-25 mg total) by mouth 3 (three) times daily as needed for anxiety (or sleep.).    Dispense:  30 tablet    Refill:  0    Order Specific Question:   Supervising Provider    Answer:   Carlota Raspberry, JEFFREY R [[id number]]    Follow-up: Return in about 6 months (around 01/30/2022) for CPE and labs.   PLAN Shingrix and flu vaccines given Bp controlled. Continue currnet regimen Start sildenafil 50-100mg  po qd prn. Reviewed risks, benefits AE. Restart hydroxyzine for anxiety. Good effect in past. Return in 6 mo for bp check and labs. Patient encouraged to call clinic with any questions, comments, or concerns.  Maximiano Coss, NP

## 2021-08-02 NOTE — Patient Instructions (Signed)
Dillon Reeves -  Doristine Devoid to see you  Sildenafil (generic Viagra): take 1/2 tab to 1 tab once daily as needed for ED.  Continue current bp meds  Take hydroxyzine as needed for stress/anxiety.  See you in 6 mo for a physical and labs with myself or Dr. Carlota Raspberry  Thank you,  Denice Paradise

## 2022-01-29 ENCOUNTER — Other Ambulatory Visit: Payer: Self-pay | Admitting: Registered Nurse

## 2022-01-29 DIAGNOSIS — I10 Essential (primary) hypertension: Secondary | ICD-10-CM

## 2022-01-31 NOTE — Telephone Encounter (Signed)
Meds refilled, 69-monthfollow-up plan in January, I do not see an appointment, please schedule follow-up in the next month or two.

## 2022-01-31 NOTE — Telephone Encounter (Signed)
Called pt no answer, LM to call back to schedule appt

## 2022-02-09 ENCOUNTER — Encounter: Payer: Self-pay | Admitting: Family Medicine

## 2022-02-09 ENCOUNTER — Ambulatory Visit: Payer: BC Managed Care – PPO | Admitting: Family Medicine

## 2022-02-09 VITALS — BP 138/86 | HR 81 | Temp 98.1°F | Resp 18 | Wt 196.0 lb

## 2022-02-09 DIAGNOSIS — R140 Abdominal distension (gaseous): Secondary | ICD-10-CM | POA: Diagnosis not present

## 2022-02-09 DIAGNOSIS — E782 Mixed hyperlipidemia: Secondary | ICD-10-CM

## 2022-02-09 DIAGNOSIS — I10 Essential (primary) hypertension: Secondary | ICD-10-CM

## 2022-02-09 DIAGNOSIS — R7303 Prediabetes: Secondary | ICD-10-CM

## 2022-02-09 DIAGNOSIS — R7989 Other specified abnormal findings of blood chemistry: Secondary | ICD-10-CM

## 2022-02-09 LAB — COMPREHENSIVE METABOLIC PANEL
ALT: 22 U/L (ref 0–53)
AST: 23 U/L (ref 0–37)
Albumin: 4.4 g/dL (ref 3.5–5.2)
Alkaline Phosphatase: 56 U/L (ref 39–117)
BUN: 12 mg/dL (ref 6–23)
CO2: 30 mEq/L (ref 19–32)
Calcium: 9.5 mg/dL (ref 8.4–10.5)
Chloride: 103 mEq/L (ref 96–112)
Creatinine, Ser: 1.67 mg/dL — ABNORMAL HIGH (ref 0.40–1.50)
GFR: 44.72 mL/min — ABNORMAL LOW (ref >60.00)
Glucose, Bld: 89 mg/dL (ref 70–99)
Potassium: 4 mEq/L (ref 3.5–5.1)
Sodium: 140 mEq/L (ref 135–145)
Total Bilirubin: 0.5 mg/dL (ref 0.2–1.2)
Total Protein: 7.3 g/dL (ref 6.0–8.3)

## 2022-02-09 LAB — LIPASE: Lipase: 20 U/L (ref 11.0–59.0)

## 2022-02-09 LAB — CBC
HCT: 43.1 % (ref 39.0–52.0)
Hemoglobin: 14.4 g/dL (ref 13.0–17.0)
MCHC: 33.4 g/dL (ref 30.0–36.0)
MCV: 93.3 fl (ref 78.0–100.0)
Platelets: 289 10*3/uL (ref 150.0–400.0)
RBC: 4.62 Mil/uL (ref 4.22–5.81)
RDW: 13.7 % (ref 11.5–15.5)
WBC: 5.5 10*3/uL (ref 4.0–10.5)

## 2022-02-09 LAB — LIPID PANEL
Cholesterol: 165 mg/dL (ref 0–200)
HDL: 74.3 mg/dL (ref >39.00)
LDL Cholesterol: 76 mg/dL (ref 0–99)
NonHDL: 90.43
Total CHOL/HDL Ratio: 2
Triglycerides: 73 mg/dL (ref 0.0–149.0)
VLDL: 14.6 mg/dL (ref 0.0–40.0)

## 2022-02-09 LAB — HEMOGLOBIN A1C: Hgb A1c MFr Bld: 6.4 % (ref 4.6–6.5)

## 2022-02-09 NOTE — Patient Instructions (Addendum)
It would be best to have at least 7 hours per night if possible.  See info on blood pressure below. Try to avoid high salt foods. I will recheck kidney test and we can discuss further at next visit. May need to meet with specialist, but can discuss next visit.  Keep a record of your blood pressures outside of the office and bring them to the next office visit. See info on bloating but I would like to discuss this further next visit as well as possible options for erectile dysfunction, but will need to make sure blood pressure is well-controlled and review labs first.  Thank you for coming in today.  Return to the clinic or go to the nearest emergency room if any of your symptoms worsen or new symptoms occur.   Abdominal Bloating When you have abdominal bloating, your abdomen may feel full, tight, or painful. It may also look bigger than normal or swollen (distended). Common causes of abdominal bloating include: Swallowing air. Constipation. Problems digesting food. Eating too much. Irritable bowel syndrome. This is a condition that affects the large intestine. Lactose intolerance. This is an inability to digest lactose, a natural sugar in dairy products. Celiac disease. This is a condition that affects the ability to digest gluten, a protein found in some grains. Gastroparesis. This is a condition that slows down the movement of food in the stomach and small intestine. It is more common in people with diabetes mellitus. Gastroesophageal reflux disease (GERD). This is a condition that makes stomach acid flow back into the esophagus. Urinary retention. This means that the body is holding onto urine, and the bladder cannot be emptied all the way. Follow these instructions at home: Eating and drinking Avoid eating too much. Try not to swallow air while talking or eating. Avoid eating while lying down. Avoid these foods and drinks: Foods that cause gas, such as broccoli, cabbage, cauliflower,  and baked beans. Carbonated drinks. Hard candy. Chewing gum. Medicines Take over-the-counter and prescription medicines only as told by your health care provider. Take probiotic medicines. These medicines contain live bacteria or yeasts that can help digestion. Take coated peppermint oil capsules. General instructions Try to exercise regularly. Exercise may help to relieve bloating that is caused by gas and relieve constipation. Keep all follow-up visits. This is important. Contact a health care provider if: You have nausea and vomiting. You have diarrhea. You have abdominal pain. You have unusual weight loss or weight gain. You have severe pain, and medicines do not help. Get help right away if: You have chest pain. You have trouble breathing. You have shortness of breath. You have trouble urinating. You have darker urine than normal. You have blood in your stools or have dark, tarry stools. These symptoms may represent a serious problem that is an emergency. Do not wait to see if the symptoms will go away. Get medical help right away. Call your local emergency services (911 in the [place] Do not drive yourself to the hospital. Summary Abdominal bloating means that the abdomen is swollen. Common causes of abdominal bloating are swallowing air, constipation, and problems digesting food. Avoid eating too much and avoid swallowing air. Avoid foods that cause gas, carbonated drinks, hard candy, and chewing gum. This information is not intended to replace advice given to you by your health care provider. Make sure you discuss any questions you have with your health care provider. Document Revised: 01/20/2020 Document Reviewed: 01/20/2020 Elsevier Patient Education  [place].  Managing Your Hypertension Hypertension, also called high blood pressure, is when the force of the blood pressing against the walls of the arteries is too strong. Arteries are blood vessels that carry  blood from your heart throughout your body. Hypertension forces the heart to work harder to pump blood and may cause the arteries to become narrow or stiff. Understanding blood pressure readings A blood pressure reading includes a higher number over a lower number: The first, or top, number is called the systolic pressure. It is a measure of the pressure in your arteries as your heart beats. The second, or bottom number, is called the diastolic pressure. It is a measure of the pressure in your arteries as the heart relaxes. For most people, a normal blood pressure is below 120/80. Your personal target blood pressure may vary depending on your medical conditions, your age, and other factors. Blood pressure is classified into four stages. Based on your blood pressure reading, your health care provider may use the following stages to determine what type of treatment you need, if any. Systolic pressure and diastolic pressure are measured in a unit called millimeters of mercury (mmHg). Normal Systolic pressure: below 254. Diastolic pressure: below 80. Elevated Systolic pressure: 270-623. Diastolic pressure: below 80. Hypertension stage 1 Systolic pressure: 762-831. Diastolic pressure: 51-76. Hypertension stage 2 Systolic pressure: 160 or above. Diastolic pressure: 90 or above. How can this condition affect me? Managing your hypertension is very important. Over time, hypertension can damage the arteries and decrease blood flow to parts of the body, including the brain, heart, and kidneys. Having untreated or uncontrolled hypertension can lead to: A heart attack. A stroke. A weakened blood vessel (aneurysm). Heart failure. Kidney damage. Eye damage. Memory and concentration problems. Vascular dementia. What actions can I take to manage this condition? Hypertension can be managed by making lifestyle changes and possibly by taking medicines. Your health care provider will help you make a plan to  bring your blood pressure within a normal range. You may be referred for counseling on a healthy diet and physical activity. Nutrition  Eat a diet that is high in fiber and potassium, and low in salt (sodium), added sugar, and fat. An example eating plan is called the DASH diet. DASH stands for Dietary Approaches to Stop Hypertension. To eat this way: Eat plenty of fresh fruits and vegetables. Try to fill one-half of your plate at each meal with fruits and vegetables. Eat whole grains, such as whole-wheat pasta, brown rice, or whole-grain bread. Fill about one-fourth of your plate with whole grains. Eat low-fat dairy products. Avoid fatty cuts of meat, processed or cured meats, and poultry with skin. Fill about one-fourth of your plate with lean proteins such as fish, chicken without skin, beans, eggs, and tofu. Avoid pre-made and processed foods. These tend to be higher in sodium, added sugar, and fat. Reduce your daily sodium intake. Many people with hypertension should eat less than 1,500 mg of sodium a day. Lifestyle  Work with your health care provider to maintain a healthy body weight or to lose weight. Ask what an ideal weight is for you. Get at least 30 minutes of exercise that causes your heart to beat faster (aerobic exercise) most days of the week. Activities may include walking, swimming, or biking. Include exercise to strengthen your muscles (resistance exercise), such as weight lifting, as part of your weekly exercise routine. Try to do these types of exercises for 30 minutes at least 3 days a week.  Do not use any products that contain nicotine or tobacco. These products include cigarettes, chewing tobacco, and vaping devices, such as e-cigarettes. If you need help quitting, ask your health care provider. Control any long-term (chronic) conditions you have, such as high cholesterol or diabetes. Identify your sources of stress and find ways to manage stress. This may include meditation,  deep breathing, or making time for fun activities. Alcohol use Do not drink alcohol if: Your health care provider tells you not to drink. You are pregnant, may be pregnant, or are planning to become pregnant. If you drink alcohol: Limit how much you have to: 0-1 drink a day for women. 0-2 drinks a day for men. Know how much alcohol is in your drink. In the [place] one drink equals one 12 oz bottle of beer (355 mL), one 5 oz glass of wine (148 mL), or one 1 oz glass of hard liquor (44 mL). Medicines Your health care provider may prescribe medicine if lifestyle changes are not enough to get your blood pressure under control and if: Your systolic blood pressure is 130 or higher. Your diastolic blood pressure is 80 or higher. Take medicines only as told by your health care provider. Follow the directions carefully. Blood pressure medicines must be taken as told by your health care provider. The medicine does not work as well when you skip doses. Skipping doses also puts you at risk for problems. Monitoring Before you monitor your blood pressure: Do not smoke, drink caffeinated beverages, or exercise within 30 minutes before taking a measurement. Use the bathroom and empty your bladder (urinate). Sit quietly for at least 5 minutes before taking measurements. Monitor your blood pressure at home as told by your health care provider. To do this: Sit with your back straight and supported. Place your feet flat on the floor. Do not cross your legs. Support your arm on a flat surface, such as a table. Make sure your upper arm is at heart level. Each time you measure, take two or three readings one minute apart and record the results. You may also need to have your blood pressure checked regularly by your health care provider. General information Talk with your health care provider about your diet, exercise habits, and other lifestyle factors that may be contributing to hypertension. Review all the  medicines you take with your health care provider because there may be side effects or interactions. Keep all follow-up visits. Your health care provider can help you create and adjust your plan for managing your high blood pressure. Where to find more information National Heart, Lung, and Blood Institute: https://wilson-eaton.com/ American Heart Association: www.heart.org Contact a health care provider if: You think you are having a reaction to medicines you have taken. You have repeated (recurrent) headaches. You feel dizzy. You have swelling in your ankles. You have trouble with your vision. Get help right away if: You develop a severe headache or confusion. You have unusual weakness or numbness, or you feel faint. You have severe pain in your chest or abdomen. You vomit repeatedly. You have trouble breathing. These symptoms may be an emergency. Get help right away. Call 911. Do not wait to see if the symptoms will go away. Do not drive yourself to the hospital. Summary Hypertension is when the force of blood pumping through your arteries is too strong. If this condition is not controlled, it may put you at risk for serious complications. Your personal target blood pressure may vary depending on your medical  conditions, your age, and other factors. For most people, a normal blood pressure is less than 120/80. Hypertension is managed by lifestyle changes, medicines, or both. Lifestyle changes to help manage hypertension include losing weight, eating a healthy, low-sodium diet, exercising more, stopping smoking, and limiting alcohol. This information is not intended to replace advice given to you by your health care provider. Make sure you discuss any questions you have with your health care provider. Document Revised: 03/03/2021 Document Reviewed: 03/03/2021 Elsevier Patient Education  [place].

## 2022-02-09 NOTE — Progress Notes (Signed)
Subjective:  Patient ID: Dillon Reeves, male    DOB: 1961-02-07  Age: 61 y.o. MRN: [id number]  CC:  Chief Complaint  Patient presents with   Hypertension    Follow up for med  ck    HPI Dillon Reeves presents for follow up. Last visit with Kathrin Ruddy in July 2022. No barriers to follow up. No health changes since last visit.   Abdominal bloating.  Notes after eating for past 1-2 years. No recent changes, no n/v.  Takes otc gasx  - some relief.  No change with use of omeprazole daily. No heartburn. No abd pain or fever.   No diarrhea or stool changes. BM daily, no constipation.  Hypertension: With CKD. Creatinine increased form 1.44 in 2019 to 1.57 in 2021, 1.61 in 12/2020. Amlodipine 10 mg daily, hydrochlorothiazide 25 mg daily, clonidine 0.1 mg if elevated readings - infrequent, 1-2x/month if more sodium.  Some nsaids for joint pains past few weeks, not this week.  4-5 hours sleep during week due to work schedule. No snoring known. No hx of OSA, usually remains asleep.longer sleep on weekends.  Home readings: none recent.  Prior prescription for erectile dysfunction med was cost prohibitive, plans to discuss further at next visit. BP Readings from Last 3 Encounters:  02/09/22 138/86  08/02/21 135/88  01/28/21 127/85   Lab Results  Component Value Date   CREATININE 1.61 (H) 01/28/2021   Hyperlipidemia: Ordered crestor last year. Plan on 47mofollow up.  Taking crestor - no new side effects or myalgias. Not fasting today.  Lab Results  Component Value Date   CHOL 245 (H) 01/28/2021   HDL 80 01/28/2021   LDLCALC 150 (H) 01/28/2021   TRIG 62 01/28/2021   CHOLHDL 3.1 01/28/2021   Lab Results  Component Value Date   ALT 11 01/28/2021   AST 14 01/28/2021   ALKPHOS 70 07/29/2019   BILITOT 0.4 01/28/2021    Prediabetes: Barely at prediabetes last year. Diet discussed by RDenice Paradise  Weight increased since last visit.  Soda/sugar beverages: less soda.  Minimal Fast food: 3-4 times per week.  Exercise: stationary bike and weights few days per week - less recently with some recent aches - improving.   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   Other Anaphylaxis   Peanut-Containing Drug Products Anaphylaxis   Prior to Admission medications   Medication Sig Start Date End Date Taking? Authorizing Provider  amLODipine (NORVASC) 10 MG tablet TAKE 1 TABLET(10 MG) BY MOUTH DAILY 01/31/22  Yes Wendie Agreste, MD  hydrochlorothiazide (HYDRODIURIL) 25 MG tablet TAKE 1 TABLET(25 MG) BY MOUTH DAILY 01/31/22  Yes Wendie Agreste, MD  loratadine (CLARITIN) 10 MG tablet Take 10 mg by mouth 1 day or 1 dose.   Yes [provider]   omeprazole (PRILOSEC) 20 MG capsule Take 1 capsule (20 mg total) by mouth daily. 08/02/21  Yes Maximiano Coss, NP  cloNIDine (CATAPRES) 0.1 MG tablet One to two tabs daily  for BP > 474 systolic or 259 diastolic Patient not taking: Reported on 02/09/2022 07/29/19   Wendall Mola, NP  hydrOXYzine (ATARAX) 25 MG tablet Take 0.5-1 tablets (12.5-25 mg total) by mouth 3 (three) times daily as needed for anxiety (or sleep.). Patient not taking: Reported on 02/09/2022 08/02/21   Maximiano Coss, NP  rosuvastatin (CRESTOR) 10 MG tablet Take 1 tablet (10 mg total) by mouth daily. Patient not taking: Reported on 02/09/2022 01/31/21   Maximiano Coss, NP  sildenafil (VIAGRA) 100 MG tablet Take 0.5-1 tablets (50-100 mg total) by mouth daily as needed for erectile dysfunction. Patient not taking: Reported on 02/09/2022 08/02/21   Maximiano Coss, NP   Social History   Socioeconomic History   Marital status: Married    Spouse name: separated   Number of children: 3   Years of education: 2+ years college   Highest education level: Not on file  Occupational History   Occupation: delivery truck Education administrator: [employer]   Occupation: bus Education administrator: [employer]  Tobacco Use   Smoking status: Never   Smokeless tobacco: Never  Vaping Use   Vaping Use: Never used  Substance and Sexual Activity   Alcohol use: Yes    Comment: once or twice every two weeks varies   Drug use: No   Sexual activity: Yes  Other Topics Concern   Not on file  Social History Narrative   Lives with his wife and their children.   Separating from wife (08/2016).   Social Determinants of Health   Financial Resource Strain: Not on file  Food Insecurity: Not on file  Transportation Needs: Not on file  Physical Activity: Not on file  Stress: Not on file  Social Connections: Not on file  Intimate Partner Violence: Not on file    Review of Systems  Constitutional:  Negative for fatigue and  unexpected weight change.  Eyes:  Negative for visual disturbance.  Respiratory:  Negative for cough, chest tightness and shortness of breath.   Cardiovascular:  Negative for chest pain, palpitations and leg swelling.  Gastrointestinal:  Negative for abdominal pain and blood in stool.  Neurological:  Negative for dizziness, light-headedness and headaches.     Objective:   Vitals:   02/09/22 1045  BP: 138/86  Pulse: 81  Resp: 18  Temp: 98.1 F (36.7 C)  SpO2: 93%  Weight: 196 lb (88.9 kg)     Physical Exam Vitals reviewed.  Constitutional:      Appearance: He is well-developed.  HENT:     Head: Normocephalic and atraumatic.  Neck:     Vascular: No  carotid bruit or JVD.  Cardiovascular:     Rate and Rhythm: Normal rate and regular rhythm.     Heart sounds: Normal heart sounds. No murmur heard. Pulmonary:     Effort: Pulmonary effort is normal.     Breath sounds: Normal breath sounds. No rales.  Abdominal:     General: Abdomen is flat. Bowel sounds are normal. There is no distension.     Palpations: There is no mass.     Tenderness: There is no abdominal tenderness. There is no guarding.  Musculoskeletal:     Right lower leg: No edema.     Left lower leg: No edema.  Skin:    General: Skin is warm and dry.  Neurological:     Mental Status: He is alert and oriented to person, place, and time.  Psychiatric:        Mood and Affect: Mood normal.        Assessment & Plan:  Dillon Reeves is a 61 y.o. male . Primary hypertension - Plan: Comprehensive metabolic panel  -Borderline control but with likely chronic kidney disease, would like to see levels below 130/80.  Continue same regimen for now with monitoring sodium intake, handout given on salty 6 and blood pressure management on after visit summary.  Recheck 1 month.  Mixed hyperlipidemia - Plan: Lipid panel  -Reports tolerating Crestor, check lipid panel, not truly fasting, may need to repeat next visit  fasting if elevated.  Continue Crestor same dose for now.  Elevated serum creatinine - Plan: Comprehensive metabolic panel  -Likely chronic kidney disease, slight trend upwards of creatinine past few years.  Avoid NSAIDs.  Maintain hydration.  Working on blood pressure control.  Updated labs now and discuss further in 1 month.  May need nephrology eval.  Prediabetes - Plan: Hemoglobin A1c  -Avoidance of sugar containing beverages is much as possible, check updated A1c and discuss further at follow-up visit in 1 month.  Abdominal bloating - Plan: Lipase, CBC  -Longstanding issue without recent changes.  Handout given as possible dietary component.  Denies reflux flare, continues on PPI.  Check CBC, lipase, recheck 1 month.  RTC/ER precautions if acute worsening.   No orders of the defined types were placed in this encounter.  Patient Instructions  It would be best to have at least 7 hours per night if possible.  See info on blood pressure below. Try to avoid high salt foods. I will recheck kidney test and we can discuss further at next visit. May need to meet with specialist, but can discuss next visit.  Keep a record of your blood pressures outside of the office and bring them to the next office visit. See info on bloating but I would like to discuss this further next visit as well as possible options for erectile dysfunction, but will need to make sure blood pressure is well-controlled and review labs first.  Thank you for coming in today.  Return to the clinic or go to the nearest emergency room if any of your symptoms worsen or new symptoms occur.   Abdominal Bloating When you have abdominal bloating, your abdomen may feel full, tight, or painful. It may also look bigger than normal or swollen (distended). Common causes of abdominal bloating include: Swallowing air. Constipation. Problems digesting food. Eating too much. Irritable bowel syndrome. This is a condition that affects the  large intestine. Lactose intolerance. This is an inability to digest lactose, a natural sugar in dairy products. Celiac  disease. This is a condition that affects the ability to digest gluten, a protein found in some grains. Gastroparesis. This is a condition that slows down the movement of food in the stomach and small intestine. It is more common in people with diabetes mellitus. Gastroesophageal reflux disease (GERD). This is a condition that makes stomach acid flow back into the esophagus. Urinary retention. This means that the body is holding onto urine, and the bladder cannot be emptied all the way. Follow these instructions at home: Eating and drinking Avoid eating too much. Try not to swallow air while talking or eating. Avoid eating while lying down. Avoid these foods and drinks: Foods that cause gas, such as broccoli, cabbage, cauliflower, and baked beans. Carbonated drinks. Hard candy. Chewing gum. Medicines Take over-the-counter and prescription medicines only as told by your health care provider. Take probiotic medicines. These medicines contain live bacteria or yeasts that can help digestion. Take coated peppermint oil capsules. General instructions Try to exercise regularly. Exercise may help to relieve bloating that is caused by gas and relieve constipation. Keep all follow-up visits. This is important. Contact a health care provider if: You have nausea and vomiting. You have diarrhea. You have abdominal pain. You have unusual weight loss or weight gain. You have severe pain, and medicines do not help. Get help right away if: You have chest pain. You have trouble breathing. You have shortness of breath. You have trouble urinating. You have darker urine than normal. You have blood in your stools or have dark, tarry stools. These symptoms may represent a serious problem that is an emergency. Do not wait to see if the symptoms will go away. Get medical help right away.  Call your local emergency services (911 in the [place] Do not drive yourself to the hospital. Summary Abdominal bloating means that the abdomen is swollen. Common causes of abdominal bloating are swallowing air, constipation, and problems digesting food. Avoid eating too much and avoid swallowing air. Avoid foods that cause gas, carbonated drinks, hard candy, and chewing gum. This information is not intended to replace advice given to you by your health care provider. Make sure you discuss any questions you have with your health care provider. Document Revised: 01/20/2020 Document Reviewed: 01/20/2020 Elsevier Patient Education  [place] Your Hypertension Hypertension, also called high blood pressure, is when the force of the blood pressing against the walls of the arteries is too strong. Arteries are blood vessels that carry blood from your heart throughout your body. Hypertension forces the heart to work harder to pump blood and may cause the arteries to become narrow or stiff. Understanding blood pressure readings A blood pressure reading includes a higher number over a lower number: The first, or top, number is called the systolic pressure. It is a measure of the pressure in your arteries as your heart beats. The second, or bottom number, is called the diastolic pressure. It is a measure of the pressure in your arteries as the heart relaxes. For most people, a normal blood pressure is below 120/80. Your personal target blood pressure may vary depending on your medical conditions, your age, and other factors. Blood pressure is classified into four stages. Based on your blood pressure reading, your health care provider may use the following stages to determine what type of treatment you need, if any. Systolic pressure and diastolic pressure are measured in a unit called millimeters of mercury (mmHg). Normal Systolic pressure: below 353. Diastolic pressure: below  80. Elevated Systolic pressure: 269-485. Diastolic pressure: below 80. Hypertension stage 1 Systolic pressure: 462-703. Diastolic pressure: 50-09. Hypertension stage 2 Systolic pressure: 381 or above. Diastolic pressure: 90 or above. How can this condition affect me? Managing your hypertension is very important. Over time, hypertension can damage the arteries and decrease blood flow to parts of the body, including the brain, heart, and kidneys. Having untreated or uncontrolled hypertension can lead to: A heart attack. A stroke. A weakened blood vessel (aneurysm). Heart failure. Kidney damage. Eye damage. Memory and concentration problems. Vascular dementia. What actions can I take to manage this condition? Hypertension can be managed by making lifestyle changes and possibly by taking medicines. Your health care provider will help you make a plan to bring your blood pressure within a normal range. You may be referred for counseling on a healthy diet and physical activity. Nutrition  Eat a diet that is high in fiber and potassium, and low in salt (sodium), added sugar, and fat. An example eating plan is called the DASH diet. DASH stands for Dietary Approaches to Stop Hypertension. To eat this way: Eat plenty of fresh fruits and vegetables. Try to fill one-half of your plate at each meal with fruits and vegetables. Eat whole grains, such as whole-wheat pasta, brown rice, or whole-grain bread. Fill about one-fourth of your plate with whole grains. Eat low-fat dairy products. Avoid fatty cuts of meat, processed or cured meats, and poultry with skin. Fill about one-fourth of your plate with lean proteins such as fish, chicken without skin, beans, eggs, and tofu. Avoid pre-made and processed foods. These tend to be higher in sodium, added sugar, and fat. Reduce your daily sodium intake. Many people with hypertension should eat less than 1,500 mg of sodium a day. Lifestyle  Work with your  health care provider to maintain a healthy body weight or to lose weight. Ask what an ideal weight is for you. Get at least 30 minutes of exercise that causes your heart to beat faster (aerobic exercise) most days of the week. Activities may include walking, swimming, or biking. Include exercise to strengthen your muscles (resistance exercise), such as weight lifting, as part of your weekly exercise routine. Try to do these types of exercises for 30 minutes at least 3 days a week. Do not use any products that contain nicotine or tobacco. These products include cigarettes, chewing tobacco, and vaping devices, such as e-cigarettes. If you need help quitting, ask your health care provider. Control any long-term (chronic) conditions you have, such as high cholesterol or diabetes. Identify your sources of stress and find ways to manage stress. This may include meditation, deep breathing, or making time for fun activities. Alcohol use Do not drink alcohol if: Your health care provider tells you not to drink. You are pregnant, may be pregnant, or are planning to become pregnant. If you drink alcohol: Limit how much you have to: 0-1 drink a day for women. 0-2 drinks a day for men. Know how much alcohol is in your drink. In the [place] one drink equals one 12 oz bottle of beer (355 mL), one 5 oz glass of wine (148 mL), or one 1 oz glass of hard liquor (44 mL). Medicines Your health care provider may prescribe medicine if lifestyle changes are not enough to get your blood pressure under control and if: Your systolic blood pressure is 130 or higher. Your diastolic blood pressure is 80 or higher. Take medicines only as told by your health care  provider. Follow the directions carefully. Blood pressure medicines must be taken as told by your health care provider. The medicine does not work as well when you skip doses. Skipping doses also puts you at risk for problems. Monitoring Before you monitor your blood  pressure: Do not smoke, drink caffeinated beverages, or exercise within 30 minutes before taking a measurement. Use the bathroom and empty your bladder (urinate). Sit quietly for at least 5 minutes before taking measurements. Monitor your blood pressure at home as told by your health care provider. To do this: Sit with your back straight and supported. Place your feet flat on the floor. Do not cross your legs. Support your arm on a flat surface, such as a table. Make sure your upper arm is at heart level. Each time you measure, take two or three readings one minute apart and record the results. You may also need to have your blood pressure checked regularly by your health care provider. General information Talk with your health care provider about your diet, exercise habits, and other lifestyle factors that may be contributing to hypertension. Review all the medicines you take with your health care provider because there may be side effects or interactions. Keep all follow-up visits. Your health care provider can help you create and adjust your plan for managing your high blood pressure. Where to find more information National Heart, Lung, and Blood Institute: https://wilson-eaton.com/ American Heart Association: www.heart.org Contact a health care provider if: You think you are having a reaction to medicines you have taken. You have repeated (recurrent) headaches. You feel dizzy. You have swelling in your ankles. You have trouble with your vision. Get help right away if: You develop a severe headache or confusion. You have unusual weakness or numbness, or you feel faint. You have severe pain in your chest or abdomen. You vomit repeatedly. You have trouble breathing. These symptoms may be an emergency. Get help right away. Call 911. Do not wait to see if the symptoms will go away. Do not drive yourself to the hospital. Summary Hypertension is when the force of blood pumping through your  arteries is too strong. If this condition is not controlled, it may put you at risk for serious complications. Your personal target blood pressure may vary depending on your medical conditions, your age, and other factors. For most people, a normal blood pressure is less than 120/80. Hypertension is managed by lifestyle changes, medicines, or both. Lifestyle changes to help manage hypertension include losing weight, eating a healthy, low-sodium diet, exercising more, stopping smoking, and limiting alcohol. This information is not intended to replace advice given to you by your health care provider. Make sure you discuss any questions you have with your health care provider. Document Revised: 03/03/2021 Document Reviewed: 03/03/2021 Elsevier Patient Education  [place],   Merri Ray, MD [place], [facility] 02/09/22 11:30 AM

## 2022-02-17 NOTE — Progress Notes (Signed)
Informed pt of lab results  

## 2022-03-17 ENCOUNTER — Ambulatory Visit: Payer: BC Managed Care – PPO | Admitting: Family Medicine

## 2022-03-17 ENCOUNTER — Encounter: Payer: Self-pay | Admitting: Family Medicine

## 2022-03-17 VITALS — BP 132/80 | HR 78 | Temp 98.4°F | Ht 71.0 in | Wt 189.2 lb

## 2022-03-17 DIAGNOSIS — R140 Abdominal distension (gaseous): Secondary | ICD-10-CM

## 2022-03-17 DIAGNOSIS — Z23 Encounter for immunization: Secondary | ICD-10-CM

## 2022-03-17 DIAGNOSIS — R7989 Other specified abnormal findings of blood chemistry: Secondary | ICD-10-CM

## 2022-03-17 NOTE — Patient Instructions (Addendum)
Try stopping preworkout supplements to see if those may be affecting your kidney test. Lab visit in 1 month for repeat levels. If still elevated, then unlikely the supplement and would be chronic kidney disease and I can refer you to nephrology. Let me know if there are questions.   If bloating returns, I recommend meeting with gastroenterology. Let me know. Keep up the good work with portion and food control.

## 2022-03-17 NOTE — Progress Notes (Signed)
Subjective:  Patient ID: Dillon Reeves, male    DOB: 09-11-1960  Age: 61 y.o. MRN: [id number]  CC:  Chief Complaint  Patient presents with   Bloated    Pt has been eating less per setting and seems to be helping some, pt states he has cut back on red meat as well    HPI Dillon Reeves presents for   Abdominal bloating: Discussed last visit, handout given on possible dietary component.  Months any symptoms.  Denies reflux flare and continued on PPI.  CBC, lipase reassuring.  He has made some dietary adjustments with some improvement.  Less red meat.  Portion control. Feeling better. No n/v. Okay past few weeks.   Elevated creatinine 1.67 at his August 10 visit, 1.61 July 29, 1.57 in January 2021.  Lowest 1.39 in 03/2019. History of hypertension, hyperlipidemia.  Continued hydration, avoidance of NSAIDs discussed. Trying to drink more water. Avoiding nsaids.  Some preworkout supplements past 15-20 years.  Flu vaccine today.   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   Other Anaphylaxis   Peanut-Containing Drug Products Anaphylaxis   Prior to Admission medications   Medication Sig Start Date End Date Taking? Authorizing Provider  amLODipine (NORVASC) 10 MG tablet TAKE 1 TABLET(10 MG) BY MOUTH DAILY 01/31/22  Yes Wendie Agreste, MD  hydrochlorothiazide (HYDRODIURIL) 25 MG tablet TAKE 1 TABLET(25 MG) BY MOUTH DAILY 01/31/22  Yes Wendie Agreste, MD  loratadine (CLARITIN) 10 MG tablet Take 10 mg by mouth 1 day or 1 dose.   Yes [provider]  omeprazole (PRILOSEC) 20 MG capsule Take 1 capsule (20 mg total) by mouth daily. 08/02/21  Yes Maximiano Coss, NP  rosuvastatin (CRESTOR) 10 MG tablet Take 1 tablet (10 mg total) by mouth daily. 01/31/21  Yes Maximiano Coss, NP  cloNIDine (CATAPRES) 0.1 MG tablet One to two tabs daily  for BP > 338 systolic or 250 diastolic Patient not taking: Reported on 02/09/2022 07/29/19   Wendall Mola, NP   Social History   Socioeconomic History   Marital status: Married    Spouse name: separated   Number of children: 3   Years of education: 2+ years college   Highest education level: Not on file  Occupational History   Occupation: delivery truck Education administrator: [employer]   Occupation: bus Education administrator: [employer]  Tobacco Use   Smoking status: Never   Smokeless tobacco: Never  Vaping Use   Vaping Use: Never used  Substance and Sexual Activity   Alcohol use: Yes    Comment: once or twice every two weeks varies   Drug use: No   Sexual activity: Yes  Other Topics Concern   Not on file  Social History Narrative   Lives with his wife and their children.   Separating from wife (08/2016).   Social Determinants of Health   Financial Resource Strain: Not on file  Food Insecurity: Not on file  Transportation Needs: Not on file  Physical Activity: Not on  file  Stress: Not on file  Social Connections: Not on file  Intimate Partner Violence: Not on file    Review of Systems Per HPI.   Objective:   Vitals:   03/17/22 1056  BP: 132/80  Pulse: 78  Temp: 98.4 F (36.9 C)  SpO2: 99%  Weight: 189 lb 3.2 oz (85.8 kg)  Height: '5\' 11"'$  (1.803 m)     Physical Exam Vitals reviewed.  Constitutional:      Appearance: He is well-developed.  HENT:     Head: Normocephalic and atraumatic.  Neck:     Vascular: No carotid bruit or JVD.  Cardiovascular:     Rate and Rhythm: Normal rate and regular rhythm.     Heart sounds: Normal heart sounds. No murmur heard. Pulmonary:     Effort: Pulmonary effort is normal.     Breath sounds: Normal breath sounds. No rales.  Abdominal:     General: Abdomen is flat. Bowel sounds are normal. There is no distension.     Tenderness: There is no abdominal tenderness. There is no guarding or rebound.  Musculoskeletal:     Right lower leg: No edema.     Left lower leg: No edema.  Skin:    General: Skin is warm and dry.  Neurological:     Mental Status: He is alert and oriented to person, place, and time.  Psychiatric:        Mood and Affect: Mood normal.      Assessment & Plan:  Dillon Reeves is a 61 y.o. male . Abdominal bloating  -Improved with dietary changes as above.  Continue same.  Option to meet with gastroenterology if symptoms recur.  He will let me know.  RTC precautions.  Need for influenza vaccination - Plan: Flu Vaccine QUAD 6+ mos PF IM (Fluarix Quad PF)  Elevated serum creatinine - Plan: Basic metabolic panel  -Suspect chronic kidney disease with underlying hypertension but he does use preworkout supplements, recommended stopping this temporarily, lab only visit in the next 1 month.  If persistent elevated creatinine at that time could consider nephrology eval but again overall similar creatinine with only minimal increase past 2 years.  Continue to avoid NSAIDs, maintain  hydration.  No orders of the defined types were placed in this encounter.  Patient Instructions  Try stopping preworkout supplements to see if those may be affecting your kidney test. Lab visit in 1 month for repeat levels. If still elevated, then unlikely the supplement and would be chronic kidney disease and I can refer you to nephrology. Let me know if there are questions.   If bloating returns, I recommend meeting with gastroenterology. Let me know. Keep up the good work with portion and food control.     Signed,   Merri Ray, MD [place], [facility] 03/17/22 11:42 AM

## 2022-04-14 ENCOUNTER — Other Ambulatory Visit (INDEPENDENT_AMBULATORY_CARE_PROVIDER_SITE_OTHER): Payer: BC Managed Care – PPO

## 2022-04-14 DIAGNOSIS — R7989 Other specified abnormal findings of blood chemistry: Secondary | ICD-10-CM

## 2022-04-14 LAB — BASIC METABOLIC PANEL
BUN: 14 mg/dL (ref 6–23)
CO2: 28 mEq/L (ref 19–32)
Calcium: 9.3 mg/dL (ref 8.4–10.5)
Chloride: 106 mEq/L (ref 96–112)
Creatinine, Ser: 1.53 mg/dL — ABNORMAL HIGH (ref 0.40–1.50)
GFR: 49.61 mL/min — ABNORMAL LOW (ref >60.00)
Glucose, Bld: 97 mg/dL (ref 70–99)
Potassium: 4.1 mEq/L (ref 3.5–5.1)
Sodium: 141 mEq/L (ref 135–145)

## 2022-04-24 NOTE — Progress Notes (Signed)
Called pt and l/m abut labs

## 2022-07-27 ENCOUNTER — Other Ambulatory Visit: Payer: Self-pay | Admitting: Family Medicine

## 2022-07-27 DIAGNOSIS — I10 Essential (primary) hypertension: Secondary | ICD-10-CM

## 2022-08-17 ENCOUNTER — Ambulatory Visit: Payer: BC Managed Care – PPO | Admitting: Family Medicine

## 2022-08-17 VITALS — BP 128/74 | HR 78 | Temp 99.3°F | Ht 71.0 in | Wt 187.2 lb

## 2022-08-17 DIAGNOSIS — N1831 Chronic kidney disease, stage 3a: Secondary | ICD-10-CM | POA: Diagnosis not present

## 2022-08-17 DIAGNOSIS — R7303 Prediabetes: Secondary | ICD-10-CM

## 2022-08-17 DIAGNOSIS — E782 Mixed hyperlipidemia: Secondary | ICD-10-CM

## 2022-08-17 DIAGNOSIS — M545 Low back pain, unspecified: Secondary | ICD-10-CM

## 2022-08-17 DIAGNOSIS — I10 Essential (primary) hypertension: Secondary | ICD-10-CM

## 2022-08-17 LAB — COMPREHENSIVE METABOLIC PANEL
ALT: 15 U/L (ref 0–53)
AST: 22 U/L (ref 0–37)
Albumin: 4.3 g/dL (ref 3.5–5.2)
Alkaline Phosphatase: 50 U/L (ref 39–117)
BUN: 13 mg/dL (ref 6–23)
CO2: 28 mEq/L (ref 19–32)
Calcium: 9.7 mg/dL (ref 8.4–10.5)
Chloride: 105 mEq/L (ref 96–112)
Creatinine, Ser: 1.44 mg/dL (ref 0.40–1.50)
GFR: 53.23 mL/min — ABNORMAL LOW (ref >60.00)
Glucose, Bld: 82 mg/dL (ref 70–99)
Potassium: 4.2 mEq/L (ref 3.5–5.1)
Sodium: 139 mEq/L (ref 135–145)
Total Bilirubin: 0.4 mg/dL (ref 0.2–1.2)
Total Protein: 7.4 g/dL (ref 6.0–8.3)

## 2022-08-17 LAB — LIPID PANEL
Cholesterol: 256 mg/dL — ABNORMAL HIGH (ref 0–200)
HDL: 80 mg/dL (ref >39.00)
LDL Cholesterol: 162 mg/dL — ABNORMAL HIGH (ref 0–99)
NonHDL: 176.31
Total CHOL/HDL Ratio: 3
Triglycerides: 70 mg/dL (ref 0.0–149.0)
VLDL: 14 mg/dL (ref 0.0–40.0)

## 2022-08-17 LAB — MICROALBUMIN / CREATININE URINE RATIO
Creatinine,U: 116.8 mg/dL
Microalb Creat Ratio: 0.8 mg/g (ref 0.0–30.0)
Microalb, Ur: 0.9 mg/dL (ref 0.0–1.9)

## 2022-08-17 MED ORDER — ROSUVASTATIN CALCIUM 10 MG PO TABS: 10.0000 mg | ORAL_TABLET | Freq: Every day | ORAL | 3 refills | Status: DC

## 2022-08-17 MED ORDER — AMLODIPINE BESYLATE 10 MG PO TABS: ORAL_TABLET | ORAL | 1 refills | Status: DC

## 2022-08-17 MED ORDER — CYCLOBENZAPRINE HCL 5 MG PO TABS: 5.0000 mg | ORAL_TABLET | Freq: Three times a day (TID) | ORAL | 1 refills | Status: DC | PRN

## 2022-08-17 MED ORDER — CLONIDINE HCL 0.1 MG PO TABS: ORAL_TABLET | ORAL | 0 refills | Status: DC

## 2022-08-17 NOTE — Patient Instructions (Addendum)
We will recheck labs - ok to restart cholesterol med for now.  Blood pressure ok today. Stop hydrochlorothiazide but continue the amlodipine. Clonidine only if elevated blood pressures. I will refer you to a kidney specialist as well.   Glad that back pain is improving. Flexeril if needed short term- at night. Return to the clinic or go to the nearest emergency room if any of your symptoms worsen or new symptoms occur.  Recheck in 2 weeks to discuss labs and erectile dysfunction.   Thanks for coming in today.

## 2022-08-17 NOTE — Progress Notes (Signed)
Subjective:  Patient ID: Dillon Reeves, male    DOB: February 12, 1961  Age: 62 y.o. MRN: [id number]  CC:  Chief Complaint  Patient presents with   Hyperlipidemia    Pt ran out of atorvastatin    Back Pain    Pt notes back strain during work out Saturday, would like muscle relaxer again   Anxiety    Pt requesting refill clonadine previously marked not taking in 8'23     HPI Dillon Reeves presents for    Hyperlipidemia: Treated with Crestor 10 mg daily. Ran out few months ago. No side effects - just ran out.   Lab Results  Component Value Date   CHOL 165 02/09/2022   HDL 74.30 02/09/2022   LDLCALC 76 02/09/2022   TRIG 73.0 02/09/2022   CHOLHDL 2 02/09/2022   Lab Results  Component Value Date   ALT 22 02/09/2022   AST 23 02/09/2022   ALKPHOS 56 02/09/2022   BILITOT 0.5 02/09/2022    Prediabetes: Weight down few pounds. No recent diet/exercise changes.  Lab Results  Component Value Date   HGBA1C 6.4 02/09/2022   Wt Readings from Last 3 Encounters:  08/17/22 187 lb 3.2 oz (84.9 kg)  03/17/22 189 lb 3.2 oz (85.8 kg)  02/09/22 196 lb (88.9 kg)     Hypertension: With CKD.  Creatinine range of 1.44 in 2019, 1.57 2021, 1.61 in July 2022, slightly lower last October at 1.53.  Advised avoidance of NSAIDs, maintenance of hypertension control, maintenance of hydration.  Also recommended cutting back on preworkout supplements with lab only visit off supplements within 1 month -creatinine did improve from 1.67-1.53. Has not taken any prework supplements since.  Few ibuprofen with recent back flare past few days only. No regular use prior.  Urinating.   Hypertension treated with amlodipine 10 mg daily, hydrochlorothiazide 25 mg daily with option of clonidine 0.1 mg if elevated readings.  As of his August visit he rarely needed that medication, only if increased sodium intake. Has cut back on the HCTZ - makes him urinate. None in past few weeks.  No recent use of  clonidine no new anxiety. Some work stress - handling ok.   BP Readings from Last 3 Encounters:  08/17/22 128/74  03/17/22 132/80  02/09/22 138/86   Lab Results  Component Value Date   CREATININE 1.53 (H) 04/14/2022   Low back pain With history of L5-S1 microdiscectomy, Dr. Lynann Bologna, 2019. Bench press machine last Saturday. Felt twinge, heat in low back. Hard to stand up initially. Improved past few days. Sore with standing/walking.  Otc back aid, ice heat. Some improvement.  No bowel or bladder incontinence, no saddle anesthesia, no lower extremity weakness. Feels like pulled muscle, flexeril worked well in past.     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   Other Anaphylaxis   Peanut-Containing Drug Products Anaphylaxis   Prior to Admission medications   Medication Sig Start Date End Date Taking? Authorizing Provider  amLODipine (NORVASC) 10 MG tablet TAKE 1  TABLET(10 MG) BY MOUTH DAILY 07/27/22  Yes Wendie Agreste, MD  hydrochlorothiazide (HYDRODIURIL) 25 MG tablet TAKE 1 TABLET(25 MG) BY MOUTH DAILY 07/27/22  Yes Wendie Agreste, MD  loratadine (CLARITIN) 10 MG tablet Take 10 mg by mouth 1 day or 1 dose.   Yes [provider]  omeprazole (PRILOSEC) 20 MG capsule Take 1 capsule (20 mg total) by mouth daily. 08/02/21  Yes Maximiano Coss, NP  cloNIDine (CATAPRES) 0.1 MG tablet One to two tabs daily  for BP > [id number] systolic or 123XX123 diastolic Patient not taking: Reported on 02/09/2022 07/29/19   Wendall Mola, NP  rosuvastatin (CRESTOR) 10 MG tablet Take 1 tablet (10 mg total) by mouth daily. Patient not taking: Reported on 08/17/2022 01/31/21   Maximiano Coss, NP   Social History   Socioeconomic History   Marital status: Married    Spouse name: separated   Number of children: 3   Years of education: 2+ years college   Highest education level: Not on file  Occupational History   Occupation: delivery truck Education administrator: [employer]   Occupation: bus Education administrator: [employer]  Tobacco Use   Smoking status: Never   Smokeless tobacco: Never  Vaping Use   Vaping Use: Never used  Substance and Sexual Activity   Alcohol use: Yes    Comment: once or twice every two weeks varies   Drug use: No   Sexual activity: Yes  Other Topics Concern   Not on file  Social History Narrative   Lives with his wife and their children.   Separating from wife (08/2016).   Social Determinants of Health   Financial Resource Strain: Not on file  Food Insecurity: Not on file  Transportation Needs: Not on file  Physical Activity: Not on file  Stress: Not on file  Social Connections: Not on file  Intimate Partner Violence: Not on file    Review of Systems  Constitutional:  Negative for fatigue and unexpected weight change.  Eyes:  Negative for visual disturbance.  Respiratory:  Negative for cough, chest tightness and  shortness of breath.   Cardiovascular:  Negative for chest pain, palpitations and leg swelling.  Gastrointestinal:  Negative for abdominal pain and blood in stool.  Neurological:  Negative for dizziness, light-headedness and headaches.     Objective:   Vitals:   08/17/22 1121  BP: 128/74  Pulse: 78  Temp: 99.3 F (37.4 C)  TempSrc: Temporal  SpO2: 96%  Weight: 187 lb 3.2 oz (84.9 kg)  Height: 5' 11"$  (1.803 m)     Physical Exam Vitals reviewed.  Constitutional:      Appearance: He is well-developed.  HENT:     Head: Normocephalic and atraumatic.  Neck:     Vascular: No carotid bruit or JVD.  Cardiovascular:     Rate  and Rhythm: Normal rate and regular rhythm.     Heart sounds: Normal heart sounds. No murmur heard. Pulmonary:     Effort: Pulmonary effort is normal.     Breath sounds: Normal breath sounds. No rales.  Musculoskeletal:     Right lower leg: No edema.     Left lower leg: No edema.     Comments: Intact range of motion of lumbar spine, no focal bony tenderness.  Describes area of paraspinals of lower lumbar spine as area of discomfort.  Negative seated straight leg raise and able to heel and toe walk without difficulty.  Skin:    General: Skin is warm and dry.  Neurological:     Mental Status: He is alert and oriented to person, place, and time.  Psychiatric:        Mood and Affect: Mood normal.        Assessment & Plan:  Dillon Reeves is a 62 y.o. male . Prediabetes  -Continue to watch weight, check labs.  Mixed hyperlipidemia - Plan: rosuvastatin (CRESTOR) 10 MG tablet, cloNIDine (CATAPRES) 0.1 MG tablet, Comprehensive metabolic panel, Lipid panel  -Tolerating statin previously, check labs, but those may be off is off meds recently.  Restart Crestor daily.   Primary hypertension - Plan: Microalbumin / creatinine urine ratio, amLODipine (NORVASC) 10 MG tablet  -Stable, discontinue hydrochlorothiazide, continue home monitoring on just  amlodipine for now.  Has option of clonidine if higher readings.  Stage 3a chronic kidney disease ([place]) - Plan: Microalbumin / creatinine urine ratio, Ambulatory referral to Nephrology  -Check microalbumin to evaluate for proteinuria.  Refer to nephrology, avoid nephrotoxins.  Continue to monitor blood pressure control.  Bilateral low back pain without sciatica, unspecified chronicity - Plan: cyclobenzaprine (FLEXERIL) 5 MG tablet  -Improving, recent flare/strain as above.  Flexeril refilled temporarily with potential side effects, risks discussed and RTC precautions given.  Meds ordered this encounter  Medications   rosuvastatin (CRESTOR) 10 MG tablet    Sig: Take 1 tablet (10 mg total) by mouth daily.    Dispense:  90 tablet    Refill:  3   cloNIDine (CATAPRES) 0.1 MG tablet    Sig: One to two tabs daily  for BP > [id number] systolic or 123XX123 diastolic    Dispense:  30 tablet    Refill:  0   cyclobenzaprine (FLEXERIL) 5 MG tablet    Sig: Take 1 tablet (5 mg total) by mouth 3 (three) times daily as needed for muscle spasms (start qhs prn due to sedation).    Dispense:  15 tablet    Refill:  1   amLODipine (NORVASC) 10 MG tablet    Sig: TAKE 1 TABLET(10 MG) BY MOUTH DAILY    Dispense:  90 tablet    Refill:  1   Patient Instructions  We will recheck labs - ok to restart cholesterol med for now.  Blood pressure ok today. Stop hydrochlorothiazide but continue the amlodipine. Clonidine only if elevated blood pressures. I will refer you to a kidney specialist as well.   Glad that back pain is improving. Flexeril if needed short term- at night. Return to the clinic or go to the nearest emergency room if any of your symptoms worsen or new symptoms occur.  Recheck in 2 weeks to discuss labs and erectile dysfunction.   Thanks for coming in today.     Signed,   Merri Ray, MD [place], [facility] 08/17/22 11:36  AM

## 2022-08-20 ENCOUNTER — Encounter: Payer: Self-pay | Admitting: Family Medicine

## 2022-08-22 ENCOUNTER — Telehealth: Payer: Self-pay

## 2022-08-22 NOTE — Telephone Encounter (Signed)
Left pt a VM to call office in regards to lab results

## 2022-08-22 NOTE — Telephone Encounter (Signed)
Informed pt of lab results  

## 2022-08-22 NOTE — Telephone Encounter (Signed)
-----   Message from Wendie Agreste, MD sent at 08/22/2022  2:01 PM EST ----- Please send lab letter:  Electrolytes, kidney, liver tests overall looked okay. Kidney function test was slightly improved from previous readings.  Cholesterol levels were elevated.  Higher than 6 months ago.  I expect those numbers will get back down with restarting Crestor.  Urine test for protein looked okay.  Let me know if there are questions.  Dr. Carlota Raspberry

## 2022-08-29 ENCOUNTER — Other Ambulatory Visit: Payer: Self-pay | Admitting: Family Medicine

## 2022-08-29 DIAGNOSIS — E782 Mixed hyperlipidemia: Secondary | ICD-10-CM

## 2022-09-04 ENCOUNTER — Ambulatory Visit: Payer: BC Managed Care – PPO | Admitting: Family Medicine

## 2022-09-04 VITALS — BP 130/76 | HR 98 | Temp 99.0°F | Ht 71.0 in | Wt 190.0 lb

## 2022-09-04 DIAGNOSIS — E782 Mixed hyperlipidemia: Secondary | ICD-10-CM

## 2022-09-04 DIAGNOSIS — R7303 Prediabetes: Secondary | ICD-10-CM

## 2022-09-04 DIAGNOSIS — N1831 Chronic kidney disease, stage 3a: Secondary | ICD-10-CM | POA: Diagnosis not present

## 2022-09-04 DIAGNOSIS — I10 Essential (primary) hypertension: Secondary | ICD-10-CM

## 2022-09-04 DIAGNOSIS — N529 Male erectile dysfunction, unspecified: Secondary | ICD-10-CM

## 2022-09-04 MED ORDER — SILDENAFIL CITRATE 20 MG PO TABS: 20.0000 mg | ORAL_TABLET | Freq: Once | ORAL | 1 refills | Status: DC | PRN

## 2022-09-04 NOTE — Progress Notes (Unsigned)
Subjective:  Patient ID: Dillon Reeves, male    DOB: Oct 29, 1960  Age: 62 y.o. MRN: [id number]  CC:  Chief Complaint  Patient presents with   Back Pain    Pt notes feeling better, notes is back to work    Erectile Dysfunction    HPI [name] presents for   Low back pain Discussed at his February visit.Improving at that time.  Short-term Flexeril if needed. Much better - near baseline, no mm relaxer needed past few days.    Hypertension Hypertension discussed last visit, hydrochlorothiazide discontinued, continued on just amlodipine with option of clonidine for high readings.  History of stage III CKD.  Improved with cessation of preworkout supplements.  Infrequent use of NSAIDs. No need for clonidine. No recent home readings.  Lab Results  Component Value Date   CREATININE 1.44 08/17/2022   BP Readings from Last 3 Encounters:  09/04/22 130/76  08/17/22 128/74  03/17/22 132/80   Hyperlipidemia: Elevated readings off meds -off meds for a few months at his last visit.  Recommended restarting Crestor, taking daily now - no new side effects.  Lab Results  Component Value Date   CHOL 256 (H) 08/17/2022   HDL 80.00 08/17/2022   LDLCALC 162 (H) 08/17/2022   TRIG 70.0 08/17/2022   CHOLHDL 3 08/17/2022   Lab Results  Component Value Date   ALT 15 08/17/2022   AST 22 08/17/2022   ALKPHOS 50 08/17/2022   BILITOT 0.4 08/17/2022   Erectile dysfunction Noted since start of blood pressure meds. Able to achieve erection, some difficulty with initiation, some with tumescence as well.  Has tried otc treatments, herbal supplement - some temporary relief, then less effective.  No CP/dyspnea with exertion. Did not try prior viagra d/t cost.   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   Other Anaphylaxis   Peanut-Containing Drug Products Anaphylaxis   Prior to Admission medications   Medication Sig Start Date End Date Taking? Authorizing Provider  amLODipine (NORVASC) 10 MG tablet TAKE 1 TABLET(10 MG) BY MOUTH DAILY 08/17/22  Yes Wendie Agreste, MD  cloNIDine (CATAPRES) 0.1 MG tablet One to two tabs daily  for BP > [id number] systolic or [id number] diastolic [id number]  Yes Wendie Agreste, MD  cyclobenzaprine (FLEXERIL) 5 MG tablet Take 1 tablet (5 mg total) by  mouth 3 (three) times daily as needed for muscle spasms (start qhs prn due to sedation). 08/17/22  Yes Wendie Agreste, MD  loratadine (CLARITIN) 10 MG tablet Take 10 mg by mouth 1 day or 1 dose.   Yes [provider]  omeprazole (PRILOSEC) 20 MG capsule Take 1 capsule (20 mg total) by mouth daily. 08/02/21  Yes Maximiano Coss, NP  rosuvastatin (CRESTOR) 10 MG tablet Take 1 tablet (10 mg total) by mouth daily. 08/17/22  Yes Wendie Agreste, MD   Social History   Socioeconomic History   Marital status: Married     Spouse name: separated   Number of children: 3   Years of education: 2+ years college   Highest education level: Not on file  Occupational History   Occupation: delivery truck Education administrator: [employer]   Occupation: bus Education administrator: [employer]  Tobacco Use   Smoking status: Never   Smokeless tobacco: Never  Vaping Use   Vaping Use: Never used  Substance and Sexual Activity   Alcohol use: Yes    Comment: once or twice every two weeks varies   Drug use: No   Sexual activity: Yes  Other Topics Concern   Not on file  Social History Narrative   Lives with his wife and their children.   Separating from wife (08/2016).   Social Determinants of Health   Financial Resource Strain: Not on file  Food Insecurity: Not on file  Transportation Needs: Not on file  Physical Activity: Not on file  Stress: Not on file  Social Connections: Not on file  Intimate Partner Violence: Not on file    Review of Systems   Objective:   Vitals:   09/04/22 1300  BP: 130/76  Pulse: 98  Temp: 99 F (37.2 C)  TempSrc: Temporal  SpO2: 98%  Weight: 190 lb (86.2 kg)  Height: '5\' 11"'$  (1.803 m)     Physical Exam Vitals reviewed.  Constitutional:      Appearance: He is well-developed.  HENT:     Head: Normocephalic and atraumatic.  Neck:     Vascular: No carotid bruit or JVD.  Cardiovascular:     Rate and Rhythm: Normal rate and regular rhythm.     Heart sounds: Normal heart sounds. No murmur heard. Pulmonary:     Effort: Pulmonary effort is normal.     Breath sounds: Normal breath sounds. No rales.  Musculoskeletal:     Right lower leg: No edema.     Left lower leg: No edema.  Skin:    General: Skin is warm and dry.  Neurological:     Mental Status: He is alert and oriented to person, place, and time.  Psychiatric:        Mood and Affect: Mood normal.        Assessment & Plan:  Dillon Reeves is a 62 y.o. male . Primary hypertension  Mixed  hyperlipidemia - Plan: Comprehensive metabolic panel, Lipid panel  Prediabetes - Plan: Hemoglobin A1c  Stage 3a chronic kidney disease (HCC)  Erectile dysfunction, unspecified erectile dysfunction type - Plan: sildenafil (REVATIO) 20 MG tablet   Meds ordered this encounter  Medications   sildenafil (REVATIO) 20 MG tablet    Sig: Take 1-3 tablets (20-60 mg total) by mouth once as needed for up to 1 dose (prior to intercourse).    Dispense:  30 tablet    Refill:  1   Patient Instructions  Same dose of meds for now. Blood pressure borderline but no changes for now.  Lab visit in the next month - fasting for 6 hours.  Lowest effective dose sildenafil.   Erectile Dysfunction Erectile dysfunction (ED) is the inability to get or keep an erection in order to have sexual intercourse. ED is considered a symptom of an underlying disorder and is not considered a disease. ED may include: Inability to get an erection. Lack of enough hardness of the erection to allow penetration. Loss of erection before sex is finished. What are the causes? This condition may be caused by: Physical causes, such as: Artery problems. This may include heart disease, high blood pressure, atherosclerosis, and diabetes. Hormonal problems, such as low testosterone. Obesity. Nerve problems. This may include back or pelvic injuries, multiple sclerosis, Parkinson's disease, spinal cord injury, and stroke. Certain medicines, such as: Pain relievers. Antidepressants. Blood pressure medicines and water pills (diuretics). Cancer medicines. Antihistamines. Muscle relaxants. Lifestyle factors, such as: Use of drugs such as marijuana, cocaine, or opioids. Excessive use of alcohol. Smoking. Lack of physical activity or exercise. Psychological causes, such as: Anxiety or stress. Sadness or depression. Exhaustion. Fear about sexual performance. Guilt. What are the signs or symptoms? Symptoms of this condition  include: Inability to get an erection. Lack of enough hardness of the erection to allow penetration. Loss of the erection before sex is finished. Sometimes having normal erections, but with frequent unsatisfactory episodes. Low sexual satisfaction in either partner due to erection problems. A curved penis occurring with erection. The curve may cause pain, or the penis may be too curved to allow for intercourse. Never having nighttime or morning erections. How is this diagnosed? This condition is often diagnosed by: Performing a physical exam to find other diseases or specific problems with the penis. Asking you detailed questions about the problem. Doing tests, such as: Blood tests to check for diabetes mellitus or high cholesterol, or to measure hormone levels. Other tests to check for underlying health conditions. An ultrasound exam to check for scarring. A test to check blood flow to the penis. Doing a sleep study at home to measure nighttime erections. How is this treated? This condition may be treated by: Medicines, such as: Medicine taken by mouth to help you achieve an erection (oral medicine). Hormone replacement therapy to replace low testosterone levels. Medicine that is injected into the penis. Your health care provider may instruct you how to give yourself these injections at home. Medicine that is delivered with a short applicator tube. The tube is inserted into the opening at the tip of the penis, which is the opening of the urethra. A tiny pellet of medicine is put in the urethra. The pellet dissolves and enhances erectile function. This is also called MUSE (medicated urethral system for erections) therapy. Vacuum pump. This is a pump with a ring on it. The pump and ring are placed on the penis and used to create pressure that helps the penis become erect. Penile implant surgery. In this procedure, you may receive: An inflatable implant. This consists of cylinders, a pump,  and a reservoir. The cylinders can be inflated with a fluid that helps to create an erection, and they can be deflated after intercourse. A semi-rigid implant. This consists of two silicone rubber rods. The rods provide some rigidity. They are also flexible, so the penis can both curve downward in its normal position and become straight for sexual intercourse. Blood vessel surgery to improve blood flow to  the penis. During this procedure, a blood vessel from a different part of the body is placed into the penis to allow blood to flow around (bypass) damaged or blocked blood vessels. Lifestyle changes, such as exercising more, losing weight, and quitting smoking. Follow these instructions at home: Medicines  Take over-the-counter and prescription medicines only as told by your health care provider. Do not increase the dosage without first discussing it with your health care provider. If you are using self-injections, do injections as directed by your health care provider. Make sure you avoid any veins that are on the surface of the penis. After giving an injection, apply pressure to the injection site for 5 minutes. Talk to your health care provider about how to prevent headaches while taking ED medicines. These medicines may cause a sudden headache due to the increase in blood flow in your body. General instructions Exercise regularly, as directed by your health care provider. Work with your health care provider to lose weight, if needed. Do not use any products that contain nicotine or tobacco. These products include cigarettes, chewing tobacco, and vaping devices, such as e-cigarettes. If you need help quitting, ask your health care provider. Before using a vacuum pump, read the instructions that come with the pump and discuss any questions with your health care provider. Keep all follow-up visits. This is important. Contact a health care provider if: You feel nauseous. You are vomiting. You get  sudden headaches while taking ED medicines. You have any concerns about your sexual health. Get help right away if: You are taking oral or injectable medicines and you have an erection that lasts longer than 4 hours. If your health care provider is unavailable, go to the nearest emergency room for evaluation. An erection that lasts much longer than 4 hours can result in permanent damage to your penis. You have severe pain in your groin or abdomen. You develop redness or severe swelling of your penis. You have redness spreading at your groin or lower abdomen. You are unable to urinate. You experience chest pain or a rapid heartbeat (palpitations) after taking oral medicines. These symptoms may represent a serious problem that is an emergency. Do not wait to see if the symptoms will go away. Get medical help right away. Call your local emergency services (911 in the [place] Do not drive yourself to the hospital. Summary Erectile dysfunction (ED) is the inability to get or keep an erection during sexual intercourse. This condition is diagnosed based on a physical exam, your symptoms, and tests to determine the cause. Treatment varies depending on the cause and may include medicines, hormone therapy, surgery, or a vacuum pump. You may need follow-up visits to make sure that you are using your medicines or devices correctly. Get help right away if you are taking or injecting medicines and you have an erection that lasts longer than 4 hours. This information is not intended to replace advice given to you by your health care provider. Make sure you discuss any questions you have with your health care provider. Document Revised: 09/15/2020 Document Reviewed: 09/15/2020 Elsevier Patient Education  [place] Your Hypertension Hypertension, also called high blood pressure, is when the force of the blood pressing against the walls of the arteries is too strong. Arteries are  blood vessels that carry blood from your heart throughout your body. Hypertension forces the heart to work harder to pump blood and may cause the arteries to  become narrow or stiff. Understanding blood pressure readings A blood pressure reading includes a higher number over a lower number: The first, or top, number is called the systolic pressure. It is a measure of the pressure in your arteries as your heart beats. The second, or bottom number, is called the diastolic pressure. It is a measure of the pressure in your arteries as the heart relaxes. For most people, a normal blood pressure is below 120/80. Your personal target blood pressure may vary depending on your medical conditions, your age, and other factors. Blood pressure is classified into four stages. Based on your blood pressure reading, your health care provider may use the following stages to determine what type of treatment you need, if any. Systolic pressure and diastolic pressure are measured in a unit called millimeters of mercury (mmHg). Normal Systolic pressure: below 123456. Diastolic pressure: below 80. Elevated Systolic pressure: [id number]. Diastolic pressure: below 80. Hypertension stage 1 Systolic pressure: 0000000. Diastolic pressure: [id number]. Hypertension stage 2 Systolic pressure: [id number] or above. Diastolic pressure: 90 or above. How can this condition affect me? Managing your hypertension is very important. Over time, hypertension can damage the arteries and decrease blood flow to parts of the body, including the brain, heart, and kidneys. Having untreated or uncontrolled hypertension can lead to: A heart attack. A stroke. A weakened blood vessel (aneurysm). Heart failure. Kidney damage. Eye damage. Memory and concentration problems. Vascular dementia. What actions can I take to manage this condition? Hypertension can be managed by making lifestyle changes and possibly by taking medicines. Your health care provider  will help you make a plan to bring your blood pressure within a normal range. You may be referred for counseling on a healthy diet and physical activity. Nutrition  Eat a diet that is high in fiber and potassium, and low in salt (sodium), added sugar, and fat. An example eating plan is called the DASH diet. DASH stands for Dietary Approaches to Stop Hypertension. To eat this way: Eat plenty of fresh fruits and vegetables. Try to fill one-half of your plate at each meal with fruits and vegetables. Eat whole grains, such as whole-wheat pasta, brown rice, or whole-grain bread. Fill about one-fourth of your plate with whole grains. Eat low-fat dairy products. Avoid fatty cuts of meat, processed or cured meats, and poultry with skin. Fill about one-fourth of your plate with lean proteins such as fish, chicken without skin, beans, eggs, and tofu. Avoid pre-made and processed foods. These tend to be higher in sodium, added sugar, and fat. Reduce your daily sodium intake. Many people with hypertension should eat less than 1,500 mg of sodium a day. Lifestyle  Work with your health care provider to maintain a healthy body weight or to lose weight. Ask what an ideal weight is for you. Get at least 30 minutes of exercise that causes your heart to beat faster (aerobic exercise) most days of the week. Activities may include walking, swimming, or biking. Include exercise to strengthen your muscles (resistance exercise), such as weight lifting, as part of your weekly exercise routine. Try to do these types of exercises for 30 minutes at least 3 days a week. Do not use any products that contain nicotine or tobacco. These products include cigarettes, chewing tobacco, and vaping devices, such as e-cigarettes. If you need help quitting, ask your health care provider. Control any long-term (chronic) conditions you have, such as high cholesterol or diabetes. Identify your sources of stress and find ways to manage stress.  This may include meditation, deep breathing, or making time for fun activities. Alcohol use Do not drink alcohol if: Your health care provider tells you not to drink. You are pregnant, may be pregnant, or are planning to become pregnant. If you drink alcohol: Limit how much you have to: 0-1 drink a day for women. 0-2 drinks a day for men. Know how much alcohol is in your drink. In the [place] one drink equals one 12 oz bottle of beer (355 mL), one 5 oz glass of wine (148 mL), or one 1 oz glass of hard liquor (44 mL). Medicines Your health care provider may prescribe medicine if lifestyle changes are not enough to get your blood pressure under control and if: Your systolic blood pressure is 130 or higher. Your diastolic blood pressure is 80 or higher. Take medicines only as told by your health care provider. Follow the directions carefully. Blood pressure medicines must be taken as told by your health care provider. The medicine does not work as well when you skip doses. Skipping doses also puts you at risk for problems. Monitoring Before you monitor your blood pressure: Do not smoke, drink caffeinated beverages, or exercise within 30 minutes before taking a measurement. Use the bathroom and empty your bladder (urinate). Sit quietly for at least 5 minutes before taking measurements. Monitor your blood pressure at home as told by your health care provider. To do this: Sit with your back straight and supported. Place your feet flat on the floor. Do not cross your legs. Support your arm on a flat surface, such as a table. Make sure your upper arm is at heart level. Each time you measure, take two or three readings one minute apart and record the results. You may also need to have your blood pressure checked regularly by your health care provider. General information Talk with your health care provider about your diet, exercise habits, and other lifestyle factors that may be contributing to  hypertension. Review all the medicines you take with your health care provider because there may be side effects or interactions. Keep all follow-up visits. Your health care provider can help you create and adjust your plan for managing your high blood pressure. Where to find more information National Heart, Lung, and Blood Institute: https://wilson-eaton.com/ American Heart Association: www.heart.org Contact a health care provider if: You think you are having a reaction to medicines you have taken. You have repeated (recurrent) headaches. You feel dizzy. You have swelling in your ankles. You have trouble with your vision. Get help right away if: You develop a severe headache or confusion. You have unusual weakness or numbness, or you feel faint. You have severe pain in your chest or abdomen. You vomit repeatedly. You have trouble breathing. These symptoms may be an emergency. Get help right away. Call 911. Do not wait to see if the symptoms will go away. Do not drive yourself to the hospital. Summary Hypertension is when the force of blood pumping through your arteries is too strong. If this condition is not controlled, it may put you at risk for serious complications. Your personal target blood pressure may vary depending on your medical conditions, your age, and other factors. For most people, a normal blood pressure is less than 120/80. Hypertension is managed by lifestyle changes, medicines, or both. Lifestyle changes to help manage hypertension include losing weight, eating a healthy, low-sodium diet, exercising more, stopping smoking, and limiting alcohol. This information is not intended to replace advice given to  you by your health care provider. Make sure you discuss any questions you have with your health care provider. Document Revised: 03/03/2021 Document Reviewed: 03/03/2021 Elsevier Patient Education  [place],   Merri Ray, MD [facility] 09/04/22 1:40 PM

## 2022-09-04 NOTE — Patient Instructions (Addendum)
Same dose of meds for now. Blood pressure borderline but no changes for now.  Lab visit in the next month - fasting for 6 hours.  Lowest effective dose sildenafil.   Erectile Dysfunction Erectile dysfunction (ED) is the inability to get or keep an erection in order to have sexual intercourse. ED is considered a symptom of an underlying disorder and is not considered a disease. ED may include: Inability to get an erection. Lack of enough hardness of the erection to allow penetration. Loss of erection before sex is finished. What are the causes? This condition may be caused by: Physical causes, such as: Artery problems. This may include heart disease, high blood pressure, atherosclerosis, and diabetes. Hormonal problems, such as low testosterone. Obesity. Nerve problems. This may include back or pelvic injuries, multiple sclerosis, Parkinson's disease, spinal cord injury, and stroke. Certain medicines, such as: Pain relievers. Antidepressants. Blood pressure medicines and water pills (diuretics). Cancer medicines. Antihistamines. Muscle relaxants. Lifestyle factors, such as: Use of drugs such as marijuana, cocaine, or opioids. Excessive use of alcohol. Smoking. Lack of physical activity or exercise. Psychological causes, such as: Anxiety or stress. Sadness or depression. Exhaustion. Fear about sexual performance. Guilt. What are the signs or symptoms? Symptoms of this condition include: Inability to get an erection. Lack of enough hardness of the erection to allow penetration. Loss of the erection before sex is finished. Sometimes having normal erections, but with frequent unsatisfactory episodes. Low sexual satisfaction in either partner due to erection problems. A curved penis occurring with erection. The curve may cause pain, or the penis may be too curved to allow for intercourse. Never having nighttime or morning erections. How is this diagnosed? This condition is often  diagnosed by: Performing a physical exam to find other diseases or specific problems with the penis. Asking you detailed questions about the problem. Doing tests, such as: Blood tests to check for diabetes mellitus or high cholesterol, or to measure hormone levels. Other tests to check for underlying health conditions. An ultrasound exam to check for scarring. A test to check blood flow to the penis. Doing a sleep study at home to measure nighttime erections. How is this treated? This condition may be treated by: Medicines, such as: Medicine taken by mouth to help you achieve an erection (oral medicine). Hormone replacement therapy to replace low testosterone levels. Medicine that is injected into the penis. Your health care provider may instruct you how to give yourself these injections at home. Medicine that is delivered with a short applicator tube. The tube is inserted into the opening at the tip of the penis, which is the opening of the urethra. A tiny pellet of medicine is put in the urethra. The pellet dissolves and enhances erectile function. This is also called MUSE (medicated urethral system for erections) therapy. Vacuum pump. This is a pump with a ring on it. The pump and ring are placed on the penis and used to create pressure that helps the penis become erect. Penile implant surgery. In this procedure, you may receive: An inflatable implant. This consists of cylinders, a pump, and a reservoir. The cylinders can be inflated with a fluid that helps to create an erection, and they can be deflated after intercourse. A semi-rigid implant. This consists of two silicone rubber rods. The rods provide some rigidity. They are also flexible, so the penis can both curve downward in its normal position and become straight for sexual intercourse. Blood vessel surgery to improve blood flow to the  penis. During this procedure, a blood vessel from a different part of the body is placed into the  penis to allow blood to flow around (bypass) damaged or blocked blood vessels. Lifestyle changes, such as exercising more, losing weight, and quitting smoking. Follow these instructions at home: Medicines  Take over-the-counter and prescription medicines only as told by your health care provider. Do not increase the dosage without first discussing it with your health care provider. If you are using self-injections, do injections as directed by your health care provider. Make sure you avoid any veins that are on the surface of the penis. After giving an injection, apply pressure to the injection site for 5 minutes. Talk to your health care provider about how to prevent headaches while taking ED medicines. These medicines may cause a sudden headache due to the increase in blood flow in your body. General instructions Exercise regularly, as directed by your health care provider. Work with your health care provider to lose weight, if needed. Do not use any products that contain nicotine or tobacco. These products include cigarettes, chewing tobacco, and vaping devices, such as e-cigarettes. If you need help quitting, ask your health care provider. Before using a vacuum pump, read the instructions that come with the pump and discuss any questions with your health care provider. Keep all follow-up visits. This is important. Contact a health care provider if: You feel nauseous. You are vomiting. You get sudden headaches while taking ED medicines. You have any concerns about your sexual health. Get help right away if: You are taking oral or injectable medicines and you have an erection that lasts longer than 4 hours. If your health care provider is unavailable, go to the nearest emergency room for evaluation. An erection that lasts much longer than 4 hours can result in permanent damage to your penis. You have severe pain in your groin or abdomen. You develop redness or severe swelling of your  penis. You have redness spreading at your groin or lower abdomen. You are unable to urinate. You experience chest pain or a rapid heartbeat (palpitations) after taking oral medicines. These symptoms may represent a serious problem that is an emergency. Do not wait to see if the symptoms will go away. Get medical help right away. Call your local emergency services (911 in the [place] Do not drive yourself to the hospital. Summary Erectile dysfunction (ED) is the inability to get or keep an erection during sexual intercourse. This condition is diagnosed based on a physical exam, your symptoms, and tests to determine the cause. Treatment varies depending on the cause and may include medicines, hormone therapy, surgery, or a vacuum pump. You may need follow-up visits to make sure that you are using your medicines or devices correctly. Get help right away if you are taking or injecting medicines and you have an erection that lasts longer than 4 hours. This information is not intended to replace advice given to you by your health care provider. Make sure you discuss any questions you have with your health care provider. Document Revised: 09/15/2020 Document Reviewed: 09/15/2020 Elsevier Patient Education  [place] Your Hypertension Hypertension, also called high blood pressure, is when the force of the blood pressing against the walls of the arteries is too strong. Arteries are blood vessels that carry blood from your heart throughout your body. Hypertension forces the heart to work harder to pump blood and may cause the arteries to become  narrow or stiff. Understanding blood pressure readings A blood pressure reading includes a higher number over a lower number: The first, or top, number is called the systolic pressure. It is a measure of the pressure in your arteries as your heart beats. The second, or bottom number, is called the diastolic pressure. It is a measure of  the pressure in your arteries as the heart relaxes. For most people, a normal blood pressure is below 120/80. Your personal target blood pressure may vary depending on your medical conditions, your age, and other factors. Blood pressure is classified into four stages. Based on your blood pressure reading, your health care provider may use the following stages to determine what type of treatment you need, if any. Systolic pressure and diastolic pressure are measured in a unit called millimeters of mercury (mmHg). Normal Systolic pressure: below 123456. Diastolic pressure: below 80. Elevated Systolic pressure: [id number]. Diastolic pressure: below 80. Hypertension stage 1 Systolic pressure: 0000000. Diastolic pressure: [id number]. Hypertension stage 2 Systolic pressure: [id number] or above. Diastolic pressure: 90 or above. How can this condition affect me? Managing your hypertension is very important. Over time, hypertension can damage the arteries and decrease blood flow to parts of the body, including the brain, heart, and kidneys. Having untreated or uncontrolled hypertension can lead to: A heart attack. A stroke. A weakened blood vessel (aneurysm). Heart failure. Kidney damage. Eye damage. Memory and concentration problems. Vascular dementia. What actions can I take to manage this condition? Hypertension can be managed by making lifestyle changes and possibly by taking medicines. Your health care provider will help you make a plan to bring your blood pressure within a normal range. You may be referred for counseling on a healthy diet and physical activity. Nutrition  Eat a diet that is high in fiber and potassium, and low in salt (sodium), added sugar, and fat. An example eating plan is called the DASH diet. DASH stands for Dietary Approaches to Stop Hypertension. To eat this way: Eat plenty of fresh fruits and vegetables. Try to fill one-half of your plate at each meal with fruits and vegetables. Eat  whole grains, such as whole-wheat pasta, brown rice, or whole-grain bread. Fill about one-fourth of your plate with whole grains. Eat low-fat dairy products. Avoid fatty cuts of meat, processed or cured meats, and poultry with skin. Fill about one-fourth of your plate with lean proteins such as fish, chicken without skin, beans, eggs, and tofu. Avoid pre-made and processed foods. These tend to be higher in sodium, added sugar, and fat. Reduce your daily sodium intake. Many people with hypertension should eat less than 1,500 mg of sodium a day. Lifestyle  Work with your health care provider to maintain a healthy body weight or to lose weight. Ask what an ideal weight is for you. Get at least 30 minutes of exercise that causes your heart to beat faster (aerobic exercise) most days of the week. Activities may include walking, swimming, or biking. Include exercise to strengthen your muscles (resistance exercise), such as weight lifting, as part of your weekly exercise routine. Try to do these types of exercises for 30 minutes at least 3 days a week. Do not use any products that contain nicotine or tobacco. These products include cigarettes, chewing tobacco, and vaping devices, such as e-cigarettes. If you need help quitting, ask your health care provider. Control any long-term (chronic) conditions you have, such as high cholesterol or diabetes. Identify your sources of stress and find ways to manage stress.  This may include meditation, deep breathing, or making time for fun activities. Alcohol use Do not drink alcohol if: Your health care provider tells you not to drink. You are pregnant, may be pregnant, or are planning to become pregnant. If you drink alcohol: Limit how much you have to: 0-1 drink a day for women. 0-2 drinks a day for men. Know how much alcohol is in your drink. In the [place] one drink equals one 12 oz bottle of beer (355 mL), one 5 oz glass of wine (148 mL), or one 1 oz glass of  hard liquor (44 mL). Medicines Your health care provider may prescribe medicine if lifestyle changes are not enough to get your blood pressure under control and if: Your systolic blood pressure is 130 or higher. Your diastolic blood pressure is 80 or higher. Take medicines only as told by your health care provider. Follow the directions carefully. Blood pressure medicines must be taken as told by your health care provider. The medicine does not work as well when you skip doses. Skipping doses also puts you at risk for problems. Monitoring Before you monitor your blood pressure: Do not smoke, drink caffeinated beverages, or exercise within 30 minutes before taking a measurement. Use the bathroom and empty your bladder (urinate). Sit quietly for at least 5 minutes before taking measurements. Monitor your blood pressure at home as told by your health care provider. To do this: Sit with your back straight and supported. Place your feet flat on the floor. Do not cross your legs. Support your arm on a flat surface, such as a table. Make sure your upper arm is at heart level. Each time you measure, take two or three readings one minute apart and record the results. You may also need to have your blood pressure checked regularly by your health care provider. General information Talk with your health care provider about your diet, exercise habits, and other lifestyle factors that may be contributing to hypertension. Review all the medicines you take with your health care provider because there may be side effects or interactions. Keep all follow-up visits. Your health care provider can help you create and adjust your plan for managing your high blood pressure. Where to find more information National Heart, Lung, and Blood Institute: https://wilson-eaton.com/ American Heart Association: www.heart.org Contact a health care provider if: You think you are having a reaction to medicines you have taken. You have  repeated (recurrent) headaches. You feel dizzy. You have swelling in your ankles. You have trouble with your vision. Get help right away if: You develop a severe headache or confusion. You have unusual weakness or numbness, or you feel faint. You have severe pain in your chest or abdomen. You vomit repeatedly. You have trouble breathing. These symptoms may be an emergency. Get help right away. Call 911. Do not wait to see if the symptoms will go away. Do not drive yourself to the hospital. Summary Hypertension is when the force of blood pumping through your arteries is too strong. If this condition is not controlled, it may put you at risk for serious complications. Your personal target blood pressure may vary depending on your medical conditions, your age, and other factors. For most people, a normal blood pressure is less than 120/80. Hypertension is managed by lifestyle changes, medicines, or both. Lifestyle changes to help manage hypertension include losing weight, eating a healthy, low-sodium diet, exercising more, stopping smoking, and limiting alcohol. This information is not intended to replace advice given  to you by your health care provider. Make sure you discuss any questions you have with your health care provider. Document Revised: 03/03/2021 Document Reviewed: 03/03/2021 Elsevier Patient Education  [place].

## 2022-09-05 ENCOUNTER — Encounter: Payer: Self-pay | Admitting: Family Medicine

## 2022-09-19 ENCOUNTER — Other Ambulatory Visit: Payer: Self-pay | Admitting: Family Medicine

## 2022-09-19 DIAGNOSIS — N529 Male erectile dysfunction, unspecified: Secondary | ICD-10-CM

## 2022-10-05 ENCOUNTER — Other Ambulatory Visit: Payer: BC Managed Care – PPO

## 2022-10-05 LAB — LIPID PANEL
Cholesterol: 161 mg/dL (ref 0–200)
HDL: 66.2 mg/dL (ref >39.00)
LDL Cholesterol: 85 mg/dL (ref 0–99)
NonHDL: 94.76
Total CHOL/HDL Ratio: 2
Triglycerides: 50 mg/dL (ref 0.0–149.0)
VLDL: 10 mg/dL (ref 0.0–40.0)

## 2022-10-05 LAB — COMPREHENSIVE METABOLIC PANEL
ALT: 17 U/L (ref 0–53)
AST: 18 U/L (ref 0–37)
Albumin: 4.2 g/dL (ref 3.5–5.2)
Alkaline Phosphatase: 52 U/L (ref 39–117)
BUN: 14 mg/dL (ref 6–23)
CO2: 28 mEq/L (ref 19–32)
Calcium: 9.3 mg/dL (ref 8.4–10.5)
Chloride: 106 mEq/L (ref 96–112)
Creatinine, Ser: 1.4 mg/dL (ref 0.40–1.50)
GFR: 55.01 mL/min — ABNORMAL LOW (ref >60.00)
Glucose, Bld: 93 mg/dL (ref 70–99)
Potassium: 4 mEq/L (ref 3.5–5.1)
Sodium: 141 mEq/L (ref 135–145)
Total Bilirubin: 0.4 mg/dL (ref 0.2–1.2)
Total Protein: 7 g/dL (ref 6.0–8.3)

## 2022-10-05 LAB — HEMOGLOBIN A1C: Hgb A1c MFr Bld: 6.3 % (ref 4.6–6.5)

## 2022-10-23 ENCOUNTER — Telehealth: Payer: Self-pay | Admitting: Family Medicine

## 2022-10-23 NOTE — Telephone Encounter (Signed)
Pt needs an appointment and we can see about removal and do some testing on vision

## 2022-10-23 NOTE — Telephone Encounter (Signed)
Caller name: RONZELL LABAN  On DPR?: Yes  Call back number: [phone] (mobile)  Provider they see: Shade Flood, MD  Reason for call: Pt inquiring about ear wax impacting hearing L ear worse as well affecting his vision.

## 2022-10-24 NOTE — Telephone Encounter (Signed)
Pt made an appointment

## 2022-10-24 NOTE — Telephone Encounter (Signed)
Called LM to have him call back to make an appointment

## 2022-10-26 ENCOUNTER — Encounter: Payer: Self-pay | Admitting: Family Medicine

## 2022-10-26 ENCOUNTER — Ambulatory Visit: Payer: BC Managed Care – PPO | Admitting: Family Medicine

## 2022-10-26 VITALS — BP 138/82 | HR 80 | Temp 98.8°F | Ht 71.0 in | Wt 186.8 lb

## 2022-10-26 DIAGNOSIS — H6123 Impacted cerumen, bilateral: Secondary | ICD-10-CM | POA: Diagnosis not present

## 2022-10-26 DIAGNOSIS — H6122 Impacted cerumen, left ear: Secondary | ICD-10-CM | POA: Diagnosis not present

## 2022-10-26 DIAGNOSIS — H6121 Impacted cerumen, right ear: Secondary | ICD-10-CM

## 2022-10-26 NOTE — Progress Notes (Signed)
Subjective:  Patient ID: Dillon Reeves, male    DOB: 1960-08-14  Age: 62 y.o. MRN: [id number]  CC:  Chief Complaint  Patient presents with   Ear Injury    Pt states he has ear fullness, buzzing and wax build up in ear.     HPI Dillon Reeves presents for   Ear fullness: Both sides, but worse on left. Feels blocked on left primarily, some on right. Decreased hearing on left.  No pain or ear d/c.  Humming sound on left past few days.  Tx: Debrox - few trials past few weeks - slight improvement - ale to open 2 weeks ago, then closes back up.    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   Other Anaphylaxis   Peanut-Containing Drug Products Anaphylaxis   Prior to Admission medications   Medication Sig Start Date End Date Taking? Authorizing Provider  amLODipine (NORVASC) 10 MG tablet TAKE 1 TABLET(10 MG) BY MOUTH DAILY 08/17/22  Yes Shade Flood, MD   cloNIDine (CATAPRES) 0.1 MG tablet One to two tabs daily  for BP > 140 systolic or 100 diastolic 08/17/22  Yes Shade Flood, MD  cyclobenzaprine (FLEXERIL) 5 MG tablet Take 1 tablet (5 mg total) by mouth 3 (three) times daily as needed for muscle spasms (start qhs prn due to sedation). 08/17/22  Yes Shade Flood, MD  loratadine (CLARITIN) 10 MG tablet Take 10 mg by mouth 1 day or 1 dose.   Yes [provider]  omeprazole (PRILOSEC) 20 MG capsule Take 1 capsule (20 mg total) by mouth daily. 08/02/21  Yes Janeece Agee, NP  rosuvastatin (CRESTOR) 10 MG tablet Take 1 tablet (10 mg total) by mouth daily. 08/17/22  Yes Shade Flood, MD  sildenafil (REVATIO) 20 MG tablet TAKE 1 TO 3 TABLET BY MOUTH AS NEEDED PRIOR TO INTERCOURSE DO NOT EXCEED 3 TABLET 09/19/22  Yes Shade Flood, MD   Social History   Socioeconomic History   Marital status: Married    Spouse name: separated   Number of children: 3   Years of education: 2+ years college   Highest education level: Not on file  Occupational History   Occupation: delivery truck Air traffic controller: [employer]   Occupation: bus driver    Employer: [employer]  Tobacco Use   Smoking status: Never   Smokeless tobacco: Never  Vaping Use   Vaping Use: Never used  Substance and Sexual Activity   Alcohol use: Yes    Comment: once or twice every two weeks varies   Drug use: No   Sexual activity: Yes  Other Topics Concern   Not on file  Social History Narrative   Lives with his wife and their children.   Separating from wife (08/2016).   Social Determinants of Health   Financial Resource Strain: Not on file  Food Insecurity: Not on file  Transportation Needs: Not on file  Physical Activity: Not on file  Stress: Not on file  Social Connections: Not on file  Intimate Partner Violence: Not on file    Review of Systems Per HPI.   Objective:   Vitals:   10/26/22 0913  BP: 138/82  Pulse: 80  Temp:  98.8 F (37.1 C)  TempSrc: Temporal  SpO2: 97%  Weight: 186 lb 12.8 oz (84.7 kg)  Height:  (1.803 m)     Physical Exam Constitutional:      General: He is not in acute distress.    Appearance: Normal appearance. He is well-developed.  HENT:     Head: Normocephalic and atraumatic.     Right Ear: There is no impacted cerumen (Not impacted but moderate cerumen at distal canal.  Removed with lighted curette, no complications, minimal erythema at the canal at 5:00 but no apparent wounds.   Hearing fine without discomfort after procedure.).     Left Ear: There is impacted cerumen (Impacted dark cerumen on left, pinnae nontender, no canal edema/erythema.).  Cardiovascular:     Rate and Rhythm: Normal rate.  Pulmonary:     Effort: Pulmonary effort is normal.  Neurological:     Mental Status: He is alert and oriented to person, place, and time.  Psychiatric:        Mood and Affect: Mood normal.    Risk/benefits/alternatives discussed for cerumen impaction treatment, lavage and use of curette for manual removal.  Discussed potential risk of infection, TM perforation although unlikely and option to meet with ENT.  Today's in office treatment and verbal consent obtained.  As above, no complications with removal from right canal with lighted curette.  Lavage by my assistant on left, with resolution of impaction, improved hearing, resolution of humming noise, minimal erythema of canal but no wounds, TM intact, no complications.   Assessment & Plan:  Dillon Reeves is a 62 y.o. male . Impacted cerumen of left ear  Excessive cerumen in ear canal, right  Hearing loss due to cerumen impaction, left  Improved with resolution of noise in ear and hearing loss with lavage on left and manual disimpaction right side.  RTC precautions given.  No orders of the defined types were placed in this encounter.  Patient Instructions  See information below regarding excess wax in the ears.   Should not have an issue for a while as we did remove wax from the right and left ear today.  If any increasing pain, or change in hearing, be seen right away but I do not expect that to happen. Thanks for coming in today.  Earwax Buildup, Adult The ears produce a substance called earwax that helps keep bacteria out of the ear and protects the skin in the ear canal. Occasionally, earwax can build up in the ear and cause discomfort or hearing loss. What are the causes? This condition is caused by a buildup of earwax. Ear canals are self-cleaning. Ear wax  is made in the outer part of the ear canal and generally falls out in small amounts over time. When the self-cleaning mechanism is not working, earwax builds up and can cause decreased hearing and discomfort. Attempting to clean ears with cotton swabs can push the earwax deep into the ear canal and cause decreased hearing and pain. What increases the risk? This condition is more likely to develop in people who: Clean their ears often with cotton swabs. Pick at their ears. Use earplugs or in-ear headphones often, or wear hearing aids. The following factors may also make you more likely to develop this condition: Being male. Being of older age. Naturally producing more earwax. Having narrow ear canals. Having earwax that is overly thick or sticky. Having excess hair in the ear canal. Having eczema. Being dehydrated. What are the signs or symptoms? Symptoms of this condition include: Reduced or muffled hearing. A feeling of fullness in the ear or feeling that the ear is plugged. Fluid coming from the ear. Ear pain or an itchy ear. Ringing in the ear. Coughing. Balance problems. An obvious piece of earwax that can be seen inside the ear canal. How is this diagnosed? This condition may be diagnosed based on: Your symptoms. Your medical history. An ear exam. During the exam, your health care provider will look into your ear with an  instrument called an otoscope. You may have tests, including a hearing test. How is this treated? This condition may be treated by: Using ear drops to soften the earwax. Having the earwax removed by a health care provider. The health care provider may: Flush the ear with water. Use an instrument that has a loop on the end (curette). Use a suction device. Having surgery to remove the wax buildup. This may be done in severe cases. Follow these instructions at home:  Take over-the-counter and prescription medicines only as told by your health care provider. Do not put any objects, including cotton swabs, into your ear. You can clean the opening of your ear canal with a washcloth or facial tissue. Follow instructions from your health care provider about cleaning your ears. Do not overclean your ears. Drink enough fluid to keep your urine pale yellow. This will help to thin the earwax. Keep all follow-up visits as told. If earwax builds up in your ears often or if you use hearing aids, consider seeing your health care provider for routine, preventive ear cleanings. Ask your health care provider how often you should schedule your cleanings. If you have hearing aids, clean them according to instructions from the manufacturer and your health care provider. Contact a health care provider if: You have ear pain. You develop a fever. You have pus or other fluid coming from your ear. You have hearing loss. You have ringing in your ears that does not go away. You feel like the room is spinning (vertigo). Your symptoms do not improve with treatment. Get help right away if: You have bleeding from the affected ear. You have severe ear pain. Summary Earwax can build up in the ear and cause discomfort or hearing loss. The most common symptoms of this condition include reduced or muffled hearing, a feeling of fullness in the ear, or feeling that the ear is plugged. This condition may be diagnosed based  on your symptoms, your medical history, and an ear exam. This condition may be treated by using ear drops to soften the earwax or by having the earwax removed by a health care provider.  Do not put any objects, including cotton swabs, into your ear. You can clean the opening of your ear canal with a washcloth or facial tissue. This information is not intended to replace advice given to you by your health care provider. Make sure you discuss any questions you have with your health care provider. Document Revised: 10/07/2019 Document Reviewed: 10/07/2019 Elsevier Patient Education  2023 Elsevier Inc.     Signed,   Meredith Staggers, MD  Primary Care, [facility] 10/26/22 9:55 AM

## 2022-10-26 NOTE — Patient Instructions (Addendum)
See information below regarding excess wax in the ears.  Should not have an issue for a while as we did remove wax from the right and left ear today.  If any increasing pain, or change in hearing, be seen right away but I do not expect that to happen. Thanks for coming in today.  Earwax Buildup, Adult The ears produce a substance called earwax that helps keep bacteria out of the ear and protects the skin in the ear canal. Occasionally, earwax can build up in the ear and cause discomfort or hearing loss. What are the causes? This condition is caused by a buildup of earwax. Ear canals are self-cleaning. Ear wax is made in the outer part of the ear canal and generally falls out in small amounts over time. When the self-cleaning mechanism is not working, earwax builds up and can cause decreased hearing and discomfort. Attempting to clean ears with cotton swabs can push the earwax deep into the ear canal and cause decreased hearing and pain. What increases the risk? This condition is more likely to develop in people who: Clean their ears often with cotton swabs. Pick at their ears. Use earplugs or in-ear headphones often, or wear hearing aids. The following factors may also make you more likely to develop this condition: Being male. Being of older age. Naturally producing more earwax. Having narrow ear canals. Having earwax that is overly thick or sticky. Having excess hair in the ear canal. Having eczema. Being dehydrated. What are the signs or symptoms? Symptoms of this condition include: Reduced or muffled hearing. A feeling of fullness in the ear or feeling that the ear is plugged. Fluid coming from the ear. Ear pain or an itchy ear. Ringing in the ear. Coughing. Balance problems. An obvious piece of earwax that can be seen inside the ear canal. How is this diagnosed? This condition may be diagnosed based on: Your symptoms. Your medical history. An ear exam. During the exam, your  health care provider will look into your ear with an instrument called an otoscope. You may have tests, including a hearing test. How is this treated? This condition may be treated by: Using ear drops to soften the earwax. Having the earwax removed by a health care provider. The health care provider may: Flush the ear with water. Use an instrument that has a loop on the end (curette). Use a suction device. Having surgery to remove the wax buildup. This may be done in severe cases. Follow these instructions at home:  Take over-the-counter and prescription medicines only as told by your health care provider. Do not put any objects, including cotton swabs, into your ear. You can clean the opening of your ear canal with a washcloth or facial tissue. Follow instructions from your health care provider about cleaning your ears. Do not overclean your ears. Drink enough fluid to keep your urine pale yellow. This will help to thin the earwax. Keep all follow-up visits as told. If earwax builds up in your ears often or if you use hearing aids, consider seeing your health care provider for routine, preventive ear cleanings. Ask your health care provider how often you should schedule your cleanings. If you have hearing aids, clean them according to instructions from the manufacturer and your health care provider. Contact a health care provider if: You have ear pain. You develop a fever. You have pus or other fluid coming from your ear. You have hearing loss. You have ringing in your ears that does  not go away. You feel like the room is spinning (vertigo). Your symptoms do not improve with treatment. Get help right away if: You have bleeding from the affected ear. You have severe ear pain. Summary Earwax can build up in the ear and cause discomfort or hearing loss. The most common symptoms of this condition include reduced or muffled hearing, a feeling of fullness in the ear, or feeling that the ear  is plugged. This condition may be diagnosed based on your symptoms, your medical history, and an ear exam. This condition may be treated by using ear drops to soften the earwax or by having the earwax removed by a health care provider. Do not put any objects, including cotton swabs, into your ear. You can clean the opening of your ear canal with a washcloth or facial tissue. This information is not intended to replace advice given to you by your health care provider. Make sure you discuss any questions you have with your health care provider. Document Revised: 10/07/2019 Document Reviewed: 10/07/2019 Elsevier Patient Education  2023 [employer].

## 2023-01-10 ENCOUNTER — Other Ambulatory Visit: Payer: Self-pay

## 2023-01-10 DIAGNOSIS — N529 Male erectile dysfunction, unspecified: Secondary | ICD-10-CM

## 2023-01-10 MED ORDER — SILDENAFIL CITRATE 20 MG PO TABS: ORAL_TABLET | ORAL | 1 refills | Status: DC

## 2023-01-11 ENCOUNTER — Telehealth: Payer: Self-pay | Admitting: Family Medicine

## 2023-01-11 NOTE — Telephone Encounter (Signed)
Patient states he called yesterday to get a refill for  sildenafil (REVATIO) 20 MG tablet   Refill was placed 7.10.24 but when he spoke with the pharmacy today, they have no record of the refill or anything for patient pick up. Please advise

## 2023-01-11 NOTE — Telephone Encounter (Signed)
Called pharmacy and they confirmed they do have it on file, pt informed

## 2023-01-19 ENCOUNTER — Other Ambulatory Visit (HOSPITAL_COMMUNITY): Payer: Self-pay

## 2023-01-19 ENCOUNTER — Telehealth: Payer: Self-pay

## 2023-01-19 DIAGNOSIS — N529 Male erectile dysfunction, unspecified: Secondary | ICD-10-CM

## 2023-01-19 NOTE — Telephone Encounter (Signed)
Pharmacy Patient Advocate Encounter   Received notification from CoverMyMeds that prior authorization for Sildenafil Citrate 20MG  tablets is required/requested.   Insurance verification completed.   The patient is insured through [facility] .   Per test claim: PA submitted to [facility] via CoverMyMeds Key/confirmation #/[id number] Status is pending

## 2023-01-22 ENCOUNTER — Other Ambulatory Visit (HOSPITAL_COMMUNITY): Payer: Self-pay

## 2023-01-22 MED ORDER — SILDENAFIL CITRATE 20 MG PO TABS: ORAL_TABLET | ORAL | 1 refills | Status: DC

## 2023-01-22 NOTE — Addendum Note (Signed)
Addended by: Meredith Staggers R on: 01/22/2023 07:52 PM   Modules accepted: Orders

## 2023-01-22 NOTE — Telephone Encounter (Signed)
Pharmacy Patient Advocate Encounter  Received notification from [facility] that Prior Authorization for Sildenafil 20mg  has been DENIED because see below.    PA #/Case ID/Reference #: [id number]   Please be advised we currently do not have a Pharmacist to review denials, therefore you will need to process appeals accordingly as needed. Thanks for your support at this time. Contact for appeals are as follows: Phone: [phone], Fax: [phone]    DENIAL LETTER ATTACHED TO MEDIA

## 2023-01-22 NOTE — Telephone Encounter (Signed)
Reordered to [employer], please advise patient of this plan.  Thanks

## 2023-01-22 NOTE — Telephone Encounter (Signed)
Pt insurance denied the Sildenafil . [facility] only charges $30  for a month supply and that is with no membership . How would you like to proceed ?

## 2023-01-23 NOTE — Telephone Encounter (Signed)
Pt reports this RX for Sildenafil has been filled with pt RX card. He reports he does not use his current insurance plan for this RX

## 2023-01-25 ENCOUNTER — Other Ambulatory Visit: Payer: Self-pay | Admitting: Family Medicine

## 2023-01-25 DIAGNOSIS — I10 Essential (primary) hypertension: Secondary | ICD-10-CM

## 2023-02-01 ENCOUNTER — Other Ambulatory Visit: Payer: Self-pay

## 2023-02-01 ENCOUNTER — Telehealth: Payer: Self-pay | Admitting: Family Medicine

## 2023-02-01 DIAGNOSIS — K219 Gastro-esophageal reflux disease without esophagitis: Secondary | ICD-10-CM

## 2023-02-01 MED ORDER — OMEPRAZOLE 20 MG PO CPDR
20.0000 mg | DELAYED_RELEASE_CAPSULE | Freq: Every day | ORAL | 3 refills | Status: DC
Start: 2023-02-01 — End: 2024-01-22

## 2023-02-01 NOTE — Telephone Encounter (Signed)
Sent in, pt has been established

## 2023-02-01 NOTE — Telephone Encounter (Signed)
Caller name: RACHIT FLADGER  On DPR?: Yes  Call back number: [phone] (mobile)  Provider they see: Shade Flood, MD  Reason for call:  Pt states he needs a refill on Omeprazole 20mg . The pharmacy told him they don't have a prescription that was written by Dr. Neva Seat so they need a new script sent in.

## 2023-02-01 NOTE — Telephone Encounter (Signed)
Pt states he does take Prilosec 20 mg daily but Janeece Agee refilled the last Rx . Would you like to refill for pt ?

## 2023-02-22 NOTE — Telephone Encounter (Addendum)
Please close encounter

## 2023-03-07 ENCOUNTER — Ambulatory Visit: Payer: BC Managed Care – PPO | Admitting: Family Medicine

## 2023-03-07 ENCOUNTER — Encounter: Payer: Self-pay | Admitting: Family Medicine

## 2023-03-07 VITALS — BP 132/90 | HR 71 | Temp 98.0°F | Wt 192.0 lb

## 2023-03-07 DIAGNOSIS — N529 Male erectile dysfunction, unspecified: Secondary | ICD-10-CM

## 2023-03-07 DIAGNOSIS — R7303 Prediabetes: Secondary | ICD-10-CM | POA: Diagnosis not present

## 2023-03-07 DIAGNOSIS — I10 Essential (primary) hypertension: Secondary | ICD-10-CM

## 2023-03-07 DIAGNOSIS — E782 Mixed hyperlipidemia: Secondary | ICD-10-CM

## 2023-03-07 DIAGNOSIS — Z23 Encounter for immunization: Secondary | ICD-10-CM | POA: Diagnosis not present

## 2023-03-07 MED ORDER — AMLODIPINE BESYLATE 10 MG PO TABS
ORAL_TABLET | ORAL | 1 refills | Status: DC
Start: 2023-03-07 — End: 2023-07-23

## 2023-03-07 MED ORDER — LOSARTAN POTASSIUM 25 MG PO TABS: 25.0000 mg | ORAL_TABLET | Freq: Every day | ORAL | 1 refills | Status: DC

## 2023-03-07 MED ORDER — SILDENAFIL CITRATE 20 MG PO TABS
ORAL_TABLET | ORAL | 5 refills | Status: DC
Start: 2023-03-07 — End: 2023-09-17

## 2023-03-07 MED ORDER — ROSUVASTATIN CALCIUM 10 MG PO TABS
10.0000 mg | ORAL_TABLET | Freq: Every day | ORAL | 3 refills | Status: DC
Start: 2023-03-07 — End: 2024-03-06

## 2023-03-07 NOTE — Patient Instructions (Addendum)
See info on blood pressure below. Continue to take amlodipine, add low dose losartan once per day. Let me know if any side effects on that med.  No other changes at this time.   Managing Your Hypertension Hypertension, also called high blood pressure, is when the force of the blood pressing against the walls of the arteries is too strong. Arteries are blood vessels that carry blood from your heart throughout your body. Hypertension forces the heart to work harder to pump blood and may cause the arteries to become narrow or stiff. Understanding blood pressure readings A blood pressure reading includes a higher number over a lower number: The first, or top, number is called the systolic pressure. It is a measure of the pressure in your arteries as your heart beats. The second, or bottom number, is called the diastolic pressure. It is a measure of the pressure in your arteries as the heart relaxes. For most people, a normal blood pressure is below 120/80. Your personal target blood pressure may vary depending on your medical conditions, your age, and other factors. Blood pressure is classified into four stages. Based on your blood pressure reading, your health care provider may use the following stages to determine what type of treatment you need, if any. Systolic pressure and diastolic pressure are measured in a unit called millimeters of mercury (mmHg). Normal Systolic pressure: below 120. Diastolic pressure: below 80. Elevated Systolic pressure: 120-129. Diastolic pressure: below 80. Hypertension stage 1 Systolic pressure: 130-139. Diastolic pressure: 80-89. Hypertension stage 2 Systolic pressure: 140 or above. Diastolic pressure: 90 or above. How can this condition affect me? Managing your hypertension is very important. Over time, hypertension can damage the arteries and decrease blood flow to parts of the body, including the brain, heart, and kidneys. Having untreated or uncontrolled  hypertension can lead to: A heart attack. A stroke. A weakened blood vessel (aneurysm). Heart failure. Kidney damage. Eye damage. Memory and concentration problems. Vascular dementia. What actions can I take to manage this condition? Hypertension can be managed by making lifestyle changes and possibly by taking medicines. Your health care provider will help you make a plan to bring your blood pressure within a normal range. You may be referred for counseling on a healthy diet and physical activity. Nutrition  Eat a diet that is high in fiber and potassium, and low in salt (sodium), added sugar, and fat. An example eating plan is called the DASH diet. DASH stands for Dietary Approaches to Stop Hypertension. To eat this way: Eat plenty of fresh fruits and vegetables. Try to fill one-half of your plate at each meal with fruits and vegetables. Eat whole grains, such as whole-wheat pasta, brown rice, or whole-grain bread. Fill about one-fourth of your plate with whole grains. Eat low-fat dairy products. Avoid fatty cuts of meat, processed or cured meats, and poultry with skin. Fill about one-fourth of your plate with lean proteins such as fish, chicken without skin, beans, eggs, and tofu. Avoid pre-made and processed foods. These tend to be higher in sodium, added sugar, and fat. Reduce your daily sodium intake. Many people with hypertension should eat less than 1,500 mg of sodium a day. Lifestyle  Work with your health care provider to maintain a healthy body weight or to lose weight. Ask what an ideal weight is for you. Get at least 30 minutes of exercise that causes your heart to beat faster (aerobic exercise) most days of the week. Activities may include walking, swimming, or biking.  Include exercise to strengthen your muscles (resistance exercise), such as weight lifting, as part of your weekly exercise routine. Try to do these types of exercises for 30 minutes at least 3 days a week. Do not  use any products that contain nicotine or tobacco. These products include cigarettes, chewing tobacco, and vaping devices, such as e-cigarettes. If you need help quitting, ask your health care provider. Control any long-term (chronic) conditions you have, such as high cholesterol or diabetes. Identify your sources of stress and find ways to manage stress. This may include meditation, deep breathing, or making time for fun activities. Alcohol use Do not drink alcohol if: Your health care provider tells you not to drink. You are pregnant, may be pregnant, or are planning to become pregnant. If you drink alcohol: Limit how much you have to: 0-1 drink a day for women. 0-2 drinks a day for men. Know how much alcohol is in your drink. In the [place] one drink equals one 12 oz bottle of beer (355 mL), one 5 oz glass of wine (148 mL), or one 1 oz glass of hard liquor (44 mL). Medicines Your health care provider may prescribe medicine if lifestyle changes are not enough to get your blood pressure under control and if: Your systolic blood pressure is 130 or higher. Your diastolic blood pressure is 80 or higher. Take medicines only as told by your health care provider. Follow the directions carefully. Blood pressure medicines must be taken as told by your health care provider. The medicine does not work as well when you skip doses. Skipping doses also puts you at risk for problems. Monitoring Before you monitor your blood pressure: Do not smoke, drink caffeinated beverages, or exercise within 30 minutes before taking a measurement. Use the bathroom and empty your bladder (urinate). Sit quietly for at least 5 minutes before taking measurements. Monitor your blood pressure at home as told by your health care provider. To do this: Sit with your back straight and supported. Place your feet flat on the floor. Do not cross your legs. Support your arm on a flat surface, such as a table. Make sure your upper  arm is at heart level. Each time you measure, take two or three readings one minute apart and record the results. You may also need to have your blood pressure checked regularly by your health care provider. General information Talk with your health care provider about your diet, exercise habits, and other lifestyle factors that may be contributing to hypertension. Review all the medicines you take with your health care provider because there may be side effects or interactions. Keep all follow-up visits. Your health care provider can help you create and adjust your plan for managing your high blood pressure. Where to find more information National Heart, [employer], and [employer] Institute: [employer] American [employer] Association: www.heart.org Contact a health care provider if: You think you are having a reaction to medicines you have taken. You have repeated (recurrent) headaches. You feel dizzy. You have swelling in your ankles. You have trouble with your vision. Get help right away if: You develop a severe headache or confusion. You have unusual weakness or numbness, or you feel faint. You have severe pain in your chest or abdomen. You vomit repeatedly. You have trouble breathing. These symptoms may be an emergency. Get help right away. Call 911. Do not wait to see if the symptoms will go away. Do not drive yourself to the hospital. Summary Hypertension is when the force  of blood pumping through your arteries is too strong. If this condition is not controlled, it may put you at risk for serious complications. Your personal target blood pressure may vary depending on your medical conditions, your age, and other factors. For most people, a normal blood pressure is less than 120/80. Hypertension is managed by lifestyle changes, medicines, or both. Lifestyle changes to help manage hypertension include losing weight, eating a healthy, low-sodium diet, exercising more, stopping smoking, and  limiting alcohol. This information is not intended to replace advice given to you by your health care provider. Make sure you discuss any questions you have with your health care provider. Document Revised: 03/03/2021 Document Reviewed: 03/03/2021 [employer] Patient Education  2024 [employer].

## 2023-03-07 NOTE — Progress Notes (Signed)
Subjective:  Patient ID: Dillon Reeves, male    DOB: 05/08/1961  Age: 62 y.o. MRN: [id number]  CC:  Chief Complaint  Patient presents with   Hypertension    Patient states his BP has been pretty good no complaints    HPI Dillon Reeves presents for  Follow up. No new concerns   Hypertension: Amlodipine 10 mg daily - forgot to take a few days ago, usually daily. clonidine if needed in the past - no recent use. Recently has been taking just 10 mg amlodipine daily.  No new side effects. Elevated creatinine previously with history of stage III CKD vs elevated Cr with muscle mass with exercise. Nephrology eval in May -  Home readings: infrequent - 130/90 range at night. Lower in am.  BP Readings from Last 3 Encounters:  03/07/23 (!) 132/90  10/26/22 138/82  09/04/22 130/76   Lab Results  Component Value Date   CREATININE 1.40 10/05/2022   Hyperlipidemia: Treated with Crestor, 10mg  every day - no new side effects/myalgias.  Lab Results  Component Value Date   CHOL 161 10/05/2022   HDL 66.20 10/05/2022   LDLCALC 85 10/05/2022   TRIG 50.0 10/05/2022   CHOLHDL 2 10/05/2022   Lab Results  Component Value Date   ALT 17 10/05/2022   AST 18 10/05/2022   ALKPHOS 52 10/05/2022   BILITOT 0.4 10/05/2022   Prediabetes: Diet/exercise approach. Minimal change in last A1c. Weight up slightly.  Less diet adherence - more home cooking in new relationship.  Lab Results  Component Value Date   HGBA1C 6.3 10/05/2022   Wt Readings from Last 3 Encounters:  03/07/23 192 lb (87.1 kg)  10/26/22 186 lb 12.8 oz (84.7 kg)  09/04/22 190 lb (86.2 kg)    Erectile dysfunction Discussed at his March visit.  Previously had not tried Viagra due to cost.  Sildenafil 20 mg ordered. Takes 1-2 - effective, no HA/flushing, no hearing/vision change, no CP/DOE.   PPI working for heartburn. Flare off meds only.   Flu vaccine today.  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   Other Anaphylaxis   Peanut-Containing Drug Products Anaphylaxis   Prior to Admission medications   Medication Sig Start Date End Date Taking? Authorizing Provider  amLODipine (NORVASC) 10 MG tablet TAKE 1 TABLET(10 MG) BY MOUTH DAILY 08/17/22  Yes Shade Flood, MD  cloNIDine (CATAPRES) 0.1 MG tablet  One to two tabs daily  for BP > 140 systolic or 100 diastolic 08/17/22  Yes Shade Flood, MD  loratadine (CLARITIN) 10 MG tablet Take 10 mg by mouth 1 day or 1 dose.   Yes [provider]  omeprazole (PRILOSEC) 20 MG capsule Take 1 capsule (20 mg total) by mouth daily. 02/01/23  Yes Shade Flood, MD  rosuvastatin (CRESTOR) 10 MG tablet Take 1 tablet (10 mg total) by mouth daily. 08/17/22  Yes Shade Flood, MD  sildenafil (REVATIO) 20 MG tablet TAKE 1 TO 3 TABLET BY MOUTH AS NEEDED PRIOR TO INTERCOURSE DO NOT EXCEED 3 TABLET 01/22/23  Yes Shade Flood, MD   cyclobenzaprine (FLEXERIL) 5 MG tablet Take 1 tablet (5 mg total) by mouth 3 (three) times daily as needed for muscle spasms (start qhs prn due to sedation). Patient not taking: Reported on 03/07/2023 08/17/22   Shade Flood, MD   Social History   Socioeconomic History   Marital status: Married    Spouse name: separated   Number of children: 3   Years of education: 2+ years college   Highest education level: Not on file  Occupational History   Occupation: delivery truck Air traffic controller: [employer]   Occupation: bus Air traffic controller: [employer]  Tobacco Use   Smoking status: Never   Smokeless tobacco: Never  Vaping Use   Vaping status: Never Used  Substance and Sexual Activity   Alcohol use: Yes    Comment: once or twice every two weeks varies   Drug use: No   Sexual activity: Yes  Other Topics Concern   Not on file  Social History Narrative   Lives with his wife and their children.   Separating from wife (08/2016).   Social Determinants of Health   Financial Resource Strain: Not on file  Food Insecurity: Not on file  Transportation Needs: Not on file  Physical Activity: Not on file  Stress: Not on file  Social Connections: Unknown (11/14/2021)   [facility]   Social Network    Social Network: Not on file  Intimate Partner Violence: Unknown (10/06/2021)   Received from [facility]   HITS    Physically Hurt: Not on file    Insult or Talk Down To: Not on file    Threaten Physical Harm: Not on file    Scream or Curse: Not on file    Review of Systems  Constitutional:  Negative for fatigue and unexpected weight change.  Eyes:  Negative for visual disturbance.  Respiratory:  Negative for cough, chest tightness and shortness of breath.   Cardiovascular:  Negative for chest pain, palpitations and leg swelling.  Gastrointestinal:  Negative for abdominal pain and blood in stool.  Neurological:  Negative for dizziness,  light-headedness and headaches.     Objective:   Vitals:   03/07/23 1248  BP: (!) 132/90  Pulse: 71  Temp: 98 F (36.7 C)  SpO2: 99%  Weight: 192 lb (87.1 kg)     Physical Exam Vitals reviewed.  Constitutional:      Appearance: He is well-developed.  HENT:     Head: Normocephalic and atraumatic.  Neck:     Vascular: No carotid bruit or JVD.  Cardiovascular:     Rate and Rhythm: Normal rate and regular rhythm.     Heart sounds: Normal heart sounds. No murmur heard. Pulmonary:     Effort: Pulmonary effort is normal.  Breath sounds: Normal breath sounds. No rales.  Musculoskeletal:     Right lower leg: No edema.     Left lower leg: No edema.  Skin:    General: Skin is warm and dry.  Neurological:     Mental Status: He is alert and oriented to person, place, and time.  Psychiatric:        Mood and Affect: Mood normal.        Assessment & Plan:  Dillon Reeves is a 62 y.o. male . Primary hypertension - Plan: amLODipine (NORVASC) 10 MG tablet, Comprehensive metabolic panel  -Borderline control, prior elevated creatinine being part due to muscle mass.  However with history of prediabetes will strive for improved control of under 130/80.  Low-dose losartan ordered.  Cough with ACE inhibitor's in the past but discussed that this would be unlikely with an ARB.  Continue same dose amlodipine, home monitoring with handout given and RTC precautions.  Flu vaccine need - Plan: Flu vaccine trivalent PF, 6mos and older(Flulaval,Afluria,Fluarix,Fluzone)  Erectile dysfunction, unspecified erectile dysfunction type - Plan: sildenafil (REVATIO) 20 MG tablet  -Stable on sildenafil, refilled.  Lowest effective dose.  We have discussed risks of this medicine previously.  Mixed hyperlipidemia - Plan: rosuvastatin (CRESTOR) 10 MG tablet, Lipid panel  -Tolerating current dose Crestor, check updated labs and adjust plan accordingly.  Prediabetes - Plan: Hemoglobin  A1c  -Slight increased weight with dietary changes, plans to improve.  Check A1c, no new meds for now.  Meds ordered this encounter  Medications   losartan (COZAAR) 25 MG tablet    Sig: Take 1 tablet (25 mg total) by mouth daily.    Dispense:  90 tablet    Refill:  1   sildenafil (REVATIO) 20 MG tablet    Sig: TAKE 1 TO 3 TABLET BY MOUTH AS NEEDED PRIOR TO INTERCOURSE DO NOT EXCEED 3 TABLET    Dispense:  30 tablet    Refill:  5   amLODipine (NORVASC) 10 MG tablet    Sig: TAKE 1 TABLET(10 MG) BY MOUTH DAILY    Dispense:  90 tablet    Refill:  1   rosuvastatin (CRESTOR) 10 MG tablet    Sig: Take 1 tablet (10 mg total) by mouth daily.    Dispense:  90 tablet    Refill:  3   Patient Instructions  See info on blood pressure below. Continue to take amlodipine, add low dose losartan once per day. Let me know if any side effects on that med.  No other changes at this time.   Managing Your Hypertension Hypertension, also called high blood pressure, is when the force of the blood pressing against the walls of the arteries is too strong. Arteries are blood vessels that carry blood from your heart throughout your body. Hypertension forces the heart to work harder to pump blood and may cause the arteries to become narrow or stiff. Understanding blood pressure readings A blood pressure reading includes a higher number over a lower number: The first, or top, number is called the systolic pressure. It is a measure of the pressure in your arteries as your heart beats. The second, or bottom number, is called the diastolic pressure. It is a measure of the pressure in your arteries as the heart relaxes. For most people, a normal blood pressure is below 120/80. Your personal target blood pressure may vary depending on your medical conditions, your age, and other factors. Blood pressure is classified into four stages.  Based on your blood pressure reading, your health care provider may use the following  stages to determine what type of treatment you need, if any. Systolic pressure and diastolic pressure are measured in a unit called millimeters of mercury (mmHg). Normal Systolic pressure: below 120. Diastolic pressure: below 80. Elevated Systolic pressure: 120-129. Diastolic pressure: below 80. Hypertension stage 1 Systolic pressure: 130-139. Diastolic pressure: 80-89. Hypertension stage 2 Systolic pressure: 140 or above. Diastolic pressure: 90 or above. How can this condition affect me? Managing your hypertension is very important. Over time, hypertension can damage the arteries and decrease blood flow to parts of the body, including the brain, heart, and kidneys. Having untreated or uncontrolled hypertension can lead to: A heart attack. A stroke. A weakened blood vessel (aneurysm). Heart failure. Kidney damage. Eye damage. Memory and concentration problems. Vascular dementia. What actions can I take to manage this condition? Hypertension can be managed by making lifestyle changes and possibly by taking medicines. Your health care provider will help you make a plan to bring your blood pressure within a normal range. You may be referred for counseling on a healthy diet and physical activity. Nutrition  Eat a diet that is high in fiber and potassium, and low in salt (sodium), added sugar, and fat. An example eating plan is called the DASH diet. DASH stands for Dietary Approaches to Stop Hypertension. To eat this way: Eat plenty of fresh fruits and vegetables. Try to fill one-half of your plate at each meal with fruits and vegetables. Eat whole grains, such as whole-wheat pasta, brown rice, or whole-grain bread. Fill about one-fourth of your plate with whole grains. Eat low-fat dairy products. Avoid fatty cuts of meat, processed or cured meats, and poultry with skin. Fill about one-fourth of your plate with lean proteins such as fish, chicken without skin, beans, eggs, and tofu. Avoid  pre-made and processed foods. These tend to be higher in sodium, added sugar, and fat. Reduce your daily sodium intake. Many people with hypertension should eat less than 1,500 mg of sodium a day. Lifestyle  Work with your health care provider to maintain a healthy body weight or to lose weight. Ask what an ideal weight is for you. Get at least 30 minutes of exercise that causes your heart to beat faster (aerobic exercise) most days of the week. Activities may include walking, swimming, or biking. Include exercise to strengthen your muscles (resistance exercise), such as weight lifting, as part of your weekly exercise routine. Try to do these types of exercises for 30 minutes at least 3 days a week. Do not use any products that contain nicotine or tobacco. These products include cigarettes, chewing tobacco, and vaping devices, such as e-cigarettes. If you need help quitting, ask your health care provider. Control any long-term (chronic) conditions you have, such as high cholesterol or diabetes. Identify your sources of stress and find ways to manage stress. This may include meditation, deep breathing, or making time for fun activities. Alcohol use Do not drink alcohol if: Your health care provider tells you not to drink. You are pregnant, may be pregnant, or are planning to become pregnant. If you drink alcohol: Limit how much you have to: 0-1 drink a day for women. 0-2 drinks a day for men. Know how much alcohol is in your drink. In the [place] one drink equals one 12 oz bottle of beer (355 mL), one 5 oz glass of wine (148 mL), or one 1 oz glass of hard liquor (44  mL). Medicines Your health care provider may prescribe medicine if lifestyle changes are not enough to get your blood pressure under control and if: Your systolic blood pressure is 130 or higher. Your diastolic blood pressure is 80 or higher. Take medicines only as told by your health care provider. Follow the directions carefully.  Blood pressure medicines must be taken as told by your health care provider. The medicine does not work as well when you skip doses. Skipping doses also puts you at risk for problems. Monitoring Before you monitor your blood pressure: Do not smoke, drink caffeinated beverages, or exercise within 30 minutes before taking a measurement. Use the bathroom and empty your bladder (urinate). Sit quietly for at least 5 minutes before taking measurements. Monitor your blood pressure at home as told by your health care provider. To do this: Sit with your back straight and supported. Place your feet flat on the floor. Do not cross your legs. Support your arm on a flat surface, such as a table. Make sure your upper arm is at heart level. Each time you measure, take two or three readings one minute apart and record the results. You may also need to have your blood pressure checked regularly by your health care provider. General information Talk with your health care provider about your diet, exercise habits, and other lifestyle factors that may be contributing to hypertension. Review all the medicines you take with your health care provider because there may be side effects or interactions. Keep all follow-up visits. Your health care provider can help you create and adjust your plan for managing your high blood pressure. Where to find more information National Heart, Lung, and Blood Institute: PopSteam.is American Heart Association: www.heart.org Contact a health care provider if: You think you are having a reaction to medicines you have taken. You have repeated (recurrent) headaches. You feel dizzy. You have swelling in your ankles. You have trouble with your vision. Get help right away if: You develop a severe headache or confusion. You have unusual weakness or numbness, or you feel faint. You have severe pain in your chest or abdomen. You vomit repeatedly. You have trouble breathing. These  symptoms may be an emergency. Get help right away. Call 911. Do not wait to see if the symptoms will go away. Do not drive yourself to the hospital. Summary Hypertension is when the force of blood pumping through your arteries is too strong. If this condition is not controlled, it may put you at risk for serious complications. Your personal target blood pressure may vary depending on your medical conditions, your age, and other factors. For most people, a normal blood pressure is less than 120/80. Hypertension is managed by lifestyle changes, medicines, or both. Lifestyle changes to help manage hypertension include losing weight, eating a healthy, low-sodium diet, exercising more, stopping smoking, and limiting alcohol. This information is not intended to replace advice given to you by your health care provider. Make sure you discuss any questions you have with your health care provider. Document Revised: 03/03/2021 Document Reviewed: 03/03/2021 Elsevier Patient Education  2024 Elsevier Inc.     Signed,   Meredith Staggers, MD [facility], [facility] 03/07/23 1:28 PM

## 2023-03-08 LAB — COMPREHENSIVE METABOLIC PANEL
ALT: 21 U/L (ref 0–53)
AST: 26 U/L (ref 0–37)
Albumin: 4.4 g/dL (ref 3.5–5.2)
Alkaline Phosphatase: 48 U/L (ref 39–117)
BUN: 10 mg/dL (ref 6–23)
CO2: 28 meq/L (ref 19–32)
Calcium: 9.3 mg/dL (ref 8.4–10.5)
Chloride: 104 meq/L (ref 96–112)
Creatinine, Ser: 1.54 mg/dL — ABNORMAL HIGH (ref 0.40–1.50)
GFR: 48.92 mL/min — ABNORMAL LOW (ref >60.00)
Glucose, Bld: 94 mg/dL (ref 70–99)
Potassium: 3.9 meq/L (ref 3.5–5.1)
Sodium: 140 meq/L (ref 135–145)
Total Bilirubin: 0.5 mg/dL (ref 0.2–1.2)
Total Protein: 7.4 g/dL (ref 6.0–8.3)

## 2023-03-08 LAB — LIPID PANEL
Cholesterol: 178 mg/dL (ref 0–200)
HDL: 98.9 mg/dL (ref >39.00)
LDL Cholesterol: 64 mg/dL (ref 0–99)
NonHDL: 78.67
Total CHOL/HDL Ratio: 2
Triglycerides: 71 mg/dL (ref 0.0–149.0)
VLDL: 14.2 mg/dL (ref 0.0–40.0)

## 2023-03-08 LAB — HEMOGLOBIN A1C: Hgb A1c MFr Bld: 6.1 % (ref 4.6–6.5)

## 2023-03-13 ENCOUNTER — Telehealth: Payer: Self-pay

## 2023-03-13 NOTE — Telephone Encounter (Signed)
Left pt a VM to call office in regards to lab results

## 2023-03-13 NOTE — Telephone Encounter (Signed)
-----   Message from Shade Flood sent at 03/13/2023  4:54 PM EDT ----- Call patient.  Cholesterol levels were normal.  76-month blood sugar test was stable.  Kidney function test has slightly increased, similar to a year ago.  Make sure to drink plenty of fluids, avoid NSAIDs like ibuprofen and Aleve recheck levels in the next 3 months.  Let me know if there are questions.

## 2023-03-14 NOTE — Telephone Encounter (Signed)
Left vm to call about labs 

## 2023-03-14 NOTE — Telephone Encounter (Signed)
Left vm to call office about labs. Letter out

## 2023-03-14 NOTE — Telephone Encounter (Signed)
Pt has received lab results. He had a question, since he has to avoid NSAIDS, is there anything you can recommend for him for inflammation? He mentioned having back surgery a while back and he has a sciatic issue.

## 2023-03-15 NOTE — Telephone Encounter (Signed)
Tylenol is preferred.  If that is ineffective we can discuss other options, that may be best through visit, virtual visit would be fine if needed.  Thanks

## 2023-03-15 NOTE — Telephone Encounter (Signed)
Pt has been informed.

## 2023-03-23 ENCOUNTER — Other Ambulatory Visit: Payer: Self-pay | Admitting: Family Medicine

## 2023-03-23 DIAGNOSIS — N529 Male erectile dysfunction, unspecified: Secondary | ICD-10-CM

## 2023-07-21 ENCOUNTER — Other Ambulatory Visit: Payer: Self-pay | Admitting: Family Medicine

## 2023-07-21 DIAGNOSIS — I10 Essential (primary) hypertension: Secondary | ICD-10-CM

## 2023-07-23 NOTE — Telephone Encounter (Signed)
Medication: Amlodipine 10 mg  Directions: Take 1 tablet by mouth daily Last given: 03/07/23 Number refills: 1 Last o/v: 03/17/23 Follow up: 09/10/23 Labs: 03/07/23

## 2023-08-31 ENCOUNTER — Other Ambulatory Visit: Payer: Self-pay | Admitting: Family Medicine

## 2023-09-01 ENCOUNTER — Other Ambulatory Visit: Payer: Self-pay

## 2023-09-01 ENCOUNTER — Ambulatory Visit
Admission: EM | Admit: 2023-09-01 | Discharge: 2023-09-01 | Disposition: A | Discharge status: Home / Self Care | Attending: Emergency Medicine | Admitting: Emergency Medicine

## 2023-09-01 ENCOUNTER — Encounter: Payer: Self-pay | Admitting: Emergency Medicine

## 2023-09-01 DIAGNOSIS — J0100 Acute maxillary sinusitis, unspecified: Secondary | ICD-10-CM

## 2023-09-01 MED ORDER — PREDNISONE 10 MG (21) PO TBPK: ORAL_TABLET | Freq: Every day | ORAL | 0 refills | Status: DC

## 2023-09-01 MED ORDER — AMOXICILLIN-POT CLAVULANATE 875-125 MG PO TABS
1.0000 | ORAL_TABLET | Freq: Two times a day (BID) | ORAL | 0 refills | Status: AC
Start: 2023-09-01 — End: 2023-09-11

## 2023-09-01 NOTE — ED Provider Notes (Signed)
 Dillon Reeves    CSN: [id number] Arrival date & time: 09/01/23  0957      History   Chief Complaint Chief Complaint  Patient presents with   Cough    HPI Dillon Reeves is a 63 y.o. male.   Patient presents for evaluation of nasal congestion, rhinorrhea, productive cough, postnasal drip, bilateral ear for left-sided headache this, sinus pressure along the cheeks and nausea without vomiting due to nasal drainage.  Initially when symptoms began was experiencing pain with breathing to the left nostril but has resolved.  Headaches have resolved.  Tolerating food and liquids.  Has attempted use of" everything over-the-counter".  History of sinusitis and severe allergies.  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   Tuberculosis 2018    Past Surgical History:  Procedure Laterality Date   LUMBAR LAMINECTOMY/DECOMPRESSION MICRODISCECTOMY N/A 03/20/2018   Procedure: LEFT LUMBAR 5- SACRUM 1 MICRODISECTOMY;  Surgeon: Estill Bamberg, MD;  Location: [facility] OR;  Service: Orthopedics;  Laterality: N/A;   OPEN REDUCTION INTERNAL FIXATION (ORIF) HAND Right 01/29/2015   Procedure: OPEN TREATMENT RIGHT FIFTH METACARPAL FRACTURE;  Surgeon: Mack Hook, MD;  Location:  [facility];  Service: Orthopedics;  Laterality: Right;       Home Medications    Prior to Admission medications   Medication Sig Start Date End Date Taking? Authorizing Provider  amoxicillin-clavulanate (AUGMENTIN) 875-125 MG tablet  Take 1 tablet by mouth every 12 (twelve) hours for 10 days. 09/01/23 09/11/23 Yes Kymari Nuon R, NP  predniSONE (STERAPRED UNI-PAK 21 TAB) 10 MG (21) TBPK tablet Take by mouth daily. Take 6 tabs by mouth daily  for 1 days, then 5 tabs for 1 days, then 4 tabs for 1 days, then 3 tabs for 1 days, 2 tabs for 1 days, then 1 tab by mouth daily for 1 days 09/01/23  Yes Ritaj Dullea R, NP  amLODipine (NORVASC) 10 MG tablet TAKE 1 TABLET(10 MG) BY MOUTH DAILY 07/23/23   Shade Flood, MD  loratadine (CLARITIN) 10 MG tablet Take 10 mg by mouth 1 day or 1 dose.    [provider]  losartan (COZAAR) 25 MG tablet TAKE 1 TABLET(25 MG) BY MOUTH DAILY 08/31/23   Shade Flood, MD  omeprazole (PRILOSEC) 20 MG capsule Take 1 capsule (20 mg total) by mouth daily. 02/01/23   Shade Flood, MD  rosuvastatin (CRESTOR) 10 MG tablet Take 1 tablet (10 mg total) by mouth daily. 03/07/23   Shade Flood, MD  sildenafil (REVATIO) 20 MG tablet TAKE 1 TO 3 TABLET BY MOUTH AS NEEDED PRIOR TO INTERCOURSE DO NOT EXCEED 3 TABLET 03/07/23   Shade Flood, MD    Family History Family History  Problem Relation Age of Onset   Heart Problems Mother    Anemia Mother    Hypertension Brother    Colon cancer Neg Hx    Esophageal cancer Neg Hx  Stomach cancer Neg Hx    Rectal cancer Neg Hx     Social History Social History   Tobacco Use   Smoking status: Never   Smokeless tobacco: Never  Vaping Use   Vaping status: Never Used  Substance Use Topics   Alcohol use: Yes    Comment: once or twice every two weeks varies   Drug use: No     Allergies   Fish-derived products, Other, and Peanut-containing drug products   Review of Systems Review of Systems   Physical Exam Triage Vital Signs ED Triage Vitals [09/01/23 1033]  Encounter Vitals Group     BP 139/83     Systolic BP Percentile      Diastolic BP Percentile      Pulse Rate 77     Resp 18     Temp 98.7 F (37.1 C)     Temp Source  Temporal     SpO2 98 %     Weight      Height      Head Circumference      Peak Flow      Pain Score 0     Pain Loc      Pain Education      Exclude from Growth Chart    No data found.  Updated Vital Signs BP 139/83 (BP Location: Left Arm)   Pulse 77   Temp 98.7 F (37.1 C) (Temporal)   Resp 18   SpO2 98%   Visual Acuity Right Eye Distance:   Left Eye Distance:   Bilateral Distance:    Right Eye Near:   Left Eye Near:    Bilateral Near:     Physical Exam Constitutional:      Appearance: Normal appearance.  HENT:     Head: Normocephalic.     Right Ear: Tympanic membrane, ear canal and external ear normal.     Left Ear: Tympanic membrane, ear canal and external ear normal.     Nose: Congestion present.     Left Sinus: Maxillary sinus tenderness and frontal sinus tenderness present.     Mouth/Throat:     Pharynx: No oropharyngeal exudate or posterior oropharyngeal erythema.  Eyes:     Extraocular Movements: Extraocular movements intact.  Cardiovascular:     Rate and Rhythm: Normal rate and regular rhythm.     Pulses: Normal pulses.     Heart sounds: Normal heart sounds.  Pulmonary:     Effort: Pulmonary effort is normal.     Breath sounds: Normal breath sounds.  Neurological:     Mental Status: He is alert and oriented to person, place, and time. Mental status is at baseline.      UC Treatments / Results  Labs (all labs ordered are listed, but only abnormal results are displayed) Labs Reviewed - No data to display  EKG   Radiology No results found.  Procedures Procedures (including critical care time)  Medications Ordered in UC Medications - No data to display  Initial Impression / Assessment and Plan / UC Course  I have reviewed the triage vital signs and the nursing notes.  Pertinent labs & imaging results that were available during my care of the patient were reviewed by me and considered in my medical decision making (see chart for  details).  Acute nonrecurrent maxillary sinusitis  Patient is in no signs of distress nor toxic appearing.  Vital signs are stable.  Low suspicion for pneumonia, pneumothorax or bronchitis and therefore will  defer imaging.  Presentation consistent with a sinusitis, prescribed Augmentin as well as prednisone to help with sinus pressure.May use additional over-the-counter medications as needed for supportive care.  May follow-up with urgent care as needed if symptoms persist or worsen.     Final Clinical Impressions(s) / UC Diagnoses   Final diagnoses:  Acute non-recurrent maxillary sinusitis     Discharge Instructions      Today you are being treated for sinus infection  Begin Augmentin every morning and every evening for 10 days to clear bacteria causing symptoms to prolong  Begin prednisone every morning with food as directed to sinus pressure, may take Tylenol in addition to this as needed  For cough: honey 1/2 to 1 teaspoon (you can dilute the honey in water or another fluid).  You can also use guaifenesin and dextromethorphan for cough. You can use a humidifier for chest congestion and cough.  If you don't have a humidifier, you can sit in the bathroom with the hot shower running.      For sore throat: try warm salt water gargles, cepacol lozenges, throat spray, warm tea or water with lemon/honey, popsicles or ice, or OTC cold relief medicine for throat discomfort.   For congestion: take a daily anti-histamine like Zyrtec, Claritin, and a oral decongestant, such as pseudoephedrine.  You can also use Flonase 1-2 sprays in each nostril daily.   It is important to stay hydrated: drink plenty of fluids (water, gatorade/powerade/pedialyte, juices, or teas) to keep your throat moisturized and help further relieve irritation/discomfort.    ED Prescriptions     Medication Sig Dispense Auth. Provider   amoxicillin-clavulanate (AUGMENTIN) 875-125 MG tablet Take 1 tablet by mouth every  12 (twelve) hours for 10 days. 20 tablet Theona Muhs R, NP   predniSONE (STERAPRED UNI-PAK 21 TAB) 10 MG (21) TBPK tablet Take by mouth daily. Take 6 tabs by mouth daily  for 1 days, then 5 tabs for 1 days, then 4 tabs for 1 days, then 3 tabs for 1 days, 2 tabs for 1 days, then 1 tab by mouth daily for 1 days 21 tablet Sandrina Heaton, Elita Boone, NP      PDMP not reviewed this encounter.   Valinda Hoar, NP 09/01/23 1115

## 2023-09-01 NOTE — ED Triage Notes (Signed)
 Patient presents to [facility] fot evalution of 3 weeks of cough, but now he feels the cough is in his chest, he has dizziness when standing up, fatigue.

## 2023-09-01 NOTE — Discharge Instructions (Addendum)
 Today you are being treated for sinus infection  Begin Augmentin every morning and every evening for 10 days to clear bacteria causing symptoms to prolong  Begin prednisone every morning with food as directed to sinus pressure, may take Tylenol in addition to this as needed  For cough: honey 1/2 to 1 teaspoon (you can dilute the honey in water or another fluid).  You can also use guaifenesin and dextromethorphan for cough. You can use a humidifier for chest congestion and cough.  If you don't have a humidifier, you can sit in the bathroom with the hot shower running.      For sore throat: try warm salt water gargles, cepacol lozenges, throat spray, warm tea or water with lemon/honey, popsicles or ice, or OTC cold relief medicine for throat discomfort.   For congestion: take a daily anti-histamine like Zyrtec, Claritin, and a oral decongestant, such as pseudoephedrine.  You can also use Flonase 1-2 sprays in each nostril daily.   It is important to stay hydrated: drink plenty of fluids (water, gatorade/powerade/pedialyte, juices, or teas) to keep your throat moisturized and help further relieve irritation/discomfort.

## 2023-09-10 ENCOUNTER — Encounter: Payer: 59 | Admitting: Family Medicine

## 2023-09-15 ENCOUNTER — Other Ambulatory Visit: Payer: Self-pay | Admitting: Family Medicine

## 2023-09-15 DIAGNOSIS — N529 Male erectile dysfunction, unspecified: Secondary | ICD-10-CM

## 2023-11-22 ENCOUNTER — Ambulatory Visit: Payer: Self-pay | Admitting: Student in an Organized Health Care Education/Training Program

## 2023-11-22 ENCOUNTER — Ambulatory Visit (INDEPENDENT_AMBULATORY_CARE_PROVIDER_SITE_OTHER)
Admission: RE | Admit: 2023-11-22 | Discharge: 2023-11-22 | Disposition: A | Discharge status: Home / Self Care | Source: Ambulatory Visit | Attending: Student in an Organized Health Care Education/Training Program | Admitting: Student in an Organized Health Care Education/Training Program

## 2023-11-22 ENCOUNTER — Other Ambulatory Visit (INDEPENDENT_AMBULATORY_CARE_PROVIDER_SITE_OTHER)

## 2023-11-22 ENCOUNTER — Ambulatory Visit: Payer: Self-pay | Admitting: *Deleted

## 2023-11-22 ENCOUNTER — Ambulatory Visit: Admitting: Student in an Organized Health Care Education/Training Program

## 2023-11-22 ENCOUNTER — Telehealth: Payer: Self-pay | Admitting: Family Medicine

## 2023-11-22 ENCOUNTER — Encounter: Payer: Self-pay | Admitting: Student in an Organized Health Care Education/Training Program

## 2023-11-22 VITALS — BP 121/67 | HR 91 | Temp 99.0°F | Wt 188.0 lb

## 2023-11-22 DIAGNOSIS — R509 Fever, unspecified: Secondary | ICD-10-CM | POA: Insufficient documentation

## 2023-11-22 LAB — COMPREHENSIVE METABOLIC PANEL WITH GFR
ALT: 36 U/L (ref 0–53)
AST: 30 U/L (ref 0–37)
Albumin: 4.3 g/dL (ref 3.5–5.2)
Alkaline Phosphatase: 69 U/L (ref 39–117)
BUN: 11 mg/dL (ref 6–23)
CO2: 25 meq/L (ref 19–32)
Calcium: 9.1 mg/dL (ref 8.4–10.5)
Chloride: 103 meq/L (ref 96–112)
Creatinine, Ser: 1.55 mg/dL — ABNORMAL HIGH (ref 0.40–1.50)
GFR: 48.3 mL/min — ABNORMAL LOW (ref >60.00)
Glucose, Bld: 101 mg/dL — ABNORMAL HIGH (ref 70–99)
Potassium: 3.9 meq/L (ref 3.5–5.1)
Sodium: 137 meq/L (ref 135–145)
Total Bilirubin: 0.6 mg/dL (ref 0.2–1.2)
Total Protein: 7.6 g/dL (ref 6.0–8.3)

## 2023-11-22 LAB — CBC WITH DIFFERENTIAL/PLATELET
Basophils Absolute: 0 10*3/uL (ref 0.0–0.1)
Basophils Relative: 0.3 % (ref 0.0–3.0)
Eosinophils Absolute: 0.2 10*3/uL (ref 0.0–0.7)
Eosinophils Relative: 1.8 % (ref 0.0–5.0)
HCT: 39.2 % (ref 39.0–52.0)
Hemoglobin: 13.5 g/dL (ref 13.0–17.0)
Lymphocytes Relative: 9 % — ABNORMAL LOW (ref 12.0–46.0)
Lymphs Abs: 0.9 10*3/uL (ref 0.7–4.0)
MCHC: 34.5 g/dL (ref 30.0–36.0)
MCV: 91.3 fl (ref 78.0–100.0)
Monocytes Absolute: 0.9 10*3/uL (ref 0.1–1.0)
Monocytes Relative: 8.8 % (ref 3.0–12.0)
Neutro Abs: 7.9 10*3/uL — ABNORMAL HIGH (ref 1.4–7.7)
Neutrophils Relative %: 80.1 % — ABNORMAL HIGH (ref 43.0–77.0)
Platelets: 334 10*3/uL (ref 150.0–400.0)
RBC: 4.29 Mil/uL (ref 4.22–5.81)
RDW: 13.5 % (ref 11.5–15.5)
WBC: 9.9 10*3/uL (ref 4.0–10.5)

## 2023-11-22 LAB — POC COVID19 BINAXNOW: SARS Coronavirus 2 Ag: NEGATIVE

## 2023-11-22 LAB — POCT INFLUENZA A/B
Influenza A, POC: NEGATIVE
Influenza B, POC: NEGATIVE

## 2023-11-22 MED ORDER — AZITHROMYCIN 250 MG PO TABS: ORAL_TABLET | ORAL | 0 refills | Status: AC

## 2023-11-22 MED ORDER — AMOXICILLIN-POT CLAVULANATE 875-125 MG PO TABS: 1.0000 | ORAL_TABLET | Freq: Two times a day (BID) | ORAL | 0 refills | Status: AC

## 2023-11-22 NOTE — Telephone Encounter (Addendum)
 Patient has an appointment for later today with Dr Gayl Katos, Andriette Banner is PCP

## 2023-11-22 NOTE — Progress Notes (Signed)
 Acute Office Visit  Subjective:     Patient ID: Dillon Reeves, male    DOB: April 06, 1961, 63 y.o.   MRN: [id number]  Chief Complaint  Patient presents with   Fever     Patinet not feeling well all week 101.4-101.6 does take medicaine to help with the fevers but the fevers come abck when medicaiton wears off, stiff neck,cold shills,bodyaches,lethargic feeling. Was on amoxicillin  last week due to tooth abscess and then the cold symptoms started Tuesday.     HPI  63 year old person here for evaluation of fevers for the last 4 days.  About 2 weeks ago was diagnosed with a maxillary dental infection and treated for 7 days with an antibiotic.  That stopped on Friday.  Last Thursday he was in the dentist office and had some drooling done of another tooth that had decay.  Definitive management of the tooth abscess will be with a root canal which has not yet been scheduled.  Starting on Monday he developed fevers, malaise, fatigue, and aches all over.  Today he does not feel well.  Reports some headache, neck stiffness, back stiffness, really just stiff all over.  No cough or shortness of breath.  No abdominal pain or diarrhea or nausea.  Eating and drinking well.  No rash, no sinus pressure, no pain in his ears.  No change in mentation.  No change in his health or other medications.  Despite having recent dental work and dental abscess, he reports no pain in his teeth or jaw or deep neck space.      Objective:     BP 121/67   Pulse 91   Temp 99 F (37.2 C) (Oral)   Wt 188 lb (85.3 kg)   SpO2 99%   BMI 26.22 kg/m    Physical Exam  Gen: Tired appearing man Ears: Bilateral tympanic membranes are normal Mouth: Oropharynx is normal, he has diffuse poor dentition, moderate gingivitis, no odontogenic abscess visible Neck: No adenopathy, no nodules, no swelling or erythema of the deep neck space or submandibular space Heart: Regular, no murmur Lungs: Brief moderate crackles at the right  lower base, clear to auscultation elsewhere Abd: Soft, nontender Ext: Warm, no edema, normal joints Neuro: Alert, conversational, full strength upper and lower extremities, normal gait, normal balance, normal mentation   Results for orders placed or performed in visit on 11/22/23  POCT Influenza A/B  Result Value Ref Range   Influenza A, POC Negative Negative   Influenza B, POC Negative Negative  POC COVID-19 BinaxNow  Result Value Ref Range   SARS Coronavirus 2 Ag Negative Negative        Assessment & Plan:   Problem List Items Addressed This Visit       Unprioritized   Fever - Primary   Acute problem the last 4 days fever with diffuse bodyaches and malaise.  No other localizing symptoms to suggest the source of the inflammation or infection.  COVID-19 care testing for influenza and COVID were negative in office.  Could be another seasonal viral infection like arbovirus.  He did have recent dental work done last week, so could be a bloodstream infection.  We will get blood work today including blood cultures.  He had mild coarse crackles on the right side, no respiratory symptoms, but will get a chest x-ray to rule out infiltrate.  Treatment will depend on the results of this testing.  For now I recommended supportive care with anti-inflammatories.  Relevant Orders   POCT Influenza A/B (Completed)   POC COVID-19 BinaxNow (Completed)   Comprehensive metabolic panel with GFR   CBC with Differential/Platelet   Culture, blood (Routine X 2) w Reflex to ID Panel   DG Chest 2 View    Ether Hercules, MD

## 2023-11-22 NOTE — Telephone Encounter (Signed)
  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 Patient did have tooth extraction and was on antibiotic- but dentist has checked area and it has healed.    Copied from CRM [id number]. Topic: Clinical - Red Word Triage >> Nov 22, 2023 11:12 AM Alethia Huxley E wrote: [facility] that prompted transfer to Nurse Triage: Fever. Patient has had a fever of 101.4 all week with body aches, and chills. Once OTC medications wear off, the fever comes back. Reason for Disposition  [1] Fever > 101 F (38.3 C) AND [2] age > 60 years  Answer Assessment - Initial Assessment Questions 1. TEMPERATURE: "What is the most recent temperature?"  "How was it measured?"      Average 101, using Tylenol - will reduce it but when medication wears off goes back up 2. ONSET: "When did the fever start?"      Monday/Tuesday  3. CHILLS: "Do you have chills?" If yes: "How bad are they?"  (e.g., none, mild, moderate, severe)   - NONE: no chills   - MILD: feeling cold   - MODERATE: feeling very cold, some shivering (feels better under a thick blanket)   - SEVERE: feeling extremely cold with shaking chills (general body shaking, rigors; even under a thick blanket)      mild 4. OTHER SYMPTOMS: "Do you have any other symptoms besides the fever?"  (e.g., abdomen pain, cough, diarrhea, earache, headache, sore throat, urination pain)     Body ache, slight nausea at times 5. CAUSE: If there are no symptoms, ask: "What do you think is causing the fever?"      unsure 6. CONTACTS: "Does anyone else in  the family have an infection?"     Public job 7. TREATMENT: "What have you done so far to treat this fever?" (e.g., medications)     Tylenol Earmon Glow flu 8. IMMUNOCOMPROMISE: "Do you have of the following: diabetes, HIV positive, splenectomy, cancer chemotherapy, chronic steroid treatment, transplant patient, etc."     no  Protocols used: Fever-A-AH

## 2023-11-22 NOTE — Telephone Encounter (Signed)
 Noted. Appreciate him seeing Mr. Ton.

## 2023-11-22 NOTE — Telephone Encounter (Signed)
 I will forward to Dr. Gayl Katos as he saw patient today. Thanks.

## 2023-11-22 NOTE — Assessment & Plan Note (Addendum)
 Acute problem the last 4 days fever with diffuse bodyaches and malaise.  No other localizing symptoms to suggest the source of the inflammation or infection.  COVID-19 care testing for influenza and COVID were negative in office.  Could be another seasonal viral infection like arbovirus.  He did have recent dental work done last week, so could be a bloodstream infection.  We will get blood work today including blood cultures.  He had mild coarse crackles on the right side, no respiratory symptoms, but will get a chest x-ray to rule out infiltrate.  Treatment will depend on the results of this testing.  For now I recommended supportive care with anti-inflammatories.  Addendum: Labs are reassuring.  On my view of the chest x-ray there is a infiltrate in the right peripheral lung, in this context seems consistent with [facility] pneumonia.  Will treat with antibiotics.

## 2023-11-22 NOTE — Telephone Encounter (Signed)
 Pt is at the [facility] location and states that they mentioned to him when they took his other two labs that they cannot do his Culture, blood (Routine X 2) w Reflex to ID Panel (Order [id number])  Please advise, Thanks

## 2023-11-22 NOTE — Addendum Note (Signed)
 Addended by: Juel Nutley T on: 11/22/2023 04:49 PM   Modules accepted: Orders

## 2023-11-23 NOTE — Telephone Encounter (Signed)
 I have Dillon Reeves and checking on why this wasn't done

## 2023-11-23 NOTE — Telephone Encounter (Signed)
 No, that is ok. I think I found the reason for his illness without the blood culture. That test can be cancelled.

## 2023-11-23 NOTE — Telephone Encounter (Signed)
 Order canceled

## 2023-11-23 NOTE — Telephone Encounter (Addendum)
 Dillon Reeves reports that Blood cultures need to be sent to [facility] or [facility] as the resulting agency then [place] [facility] [place] can collect and send out! If you can reorder the blood culture you want for one of those we can contact the patient and let him know about this being fixed

## 2023-11-23 NOTE — Addendum Note (Signed)
 Addended by: Tearra Ouk K on: 11/23/2023 11:51 AM   Modules accepted: Orders

## 2023-11-28 ENCOUNTER — Other Ambulatory Visit: Payer: Self-pay

## 2023-11-28 ENCOUNTER — Emergency Department
Admission: EM | Admit: 2023-11-28 | Discharge: 2023-11-28 | Disposition: A | Discharge status: Home / Self Care | Attending: Emergency Medicine | Admitting: Emergency Medicine

## 2023-11-28 DIAGNOSIS — T783XXA Angioneurotic edema, initial encounter: Secondary | ICD-10-CM | POA: Diagnosis not present

## 2023-11-28 DIAGNOSIS — R220 Localized swelling, mass and lump, head: Secondary | ICD-10-CM | POA: Diagnosis present

## 2023-11-28 LAB — CBC WITH DIFFERENTIAL/PLATELET
Abs Immature Granulocytes: 0.02 10*3/uL (ref 0.00–0.07)
Basophils Absolute: 0.1 10*3/uL (ref 0.0–0.1)
Basophils Relative: 1 %
Eosinophils Absolute: 0.4 10*3/uL (ref 0.0–0.5)
Eosinophils Relative: 5 %
HCT: 38.9 % — ABNORMAL LOW (ref 39.0–52.0)
Hemoglobin: 13.1 g/dL (ref 13.0–17.0)
Immature Granulocytes: 0 %
Lymphocytes Relative: 29 %
Lymphs Abs: 2.1 10*3/uL (ref 0.7–4.0)
MCH: 31 pg (ref 26.0–34.0)
MCHC: 33.7 g/dL (ref 30.0–36.0)
MCV: 92.2 fL (ref 80.0–100.0)
Monocytes Absolute: 0.6 10*3/uL (ref 0.1–1.0)
Monocytes Relative: 8 %
Neutro Abs: 4.1 10*3/uL (ref 1.7–7.7)
Neutrophils Relative %: 57 %
Platelets: 544 10*3/uL — ABNORMAL HIGH (ref 150–400)
RBC: 4.22 MIL/uL (ref 4.22–5.81)
RDW: 12.5 % (ref 11.5–15.5)
WBC: 7.2 10*3/uL (ref 4.0–10.5)
nRBC: 0 % (ref 0.0–0.2)

## 2023-11-28 LAB — BASIC METABOLIC PANEL WITH GFR
Anion gap: 11 (ref 5–15)
BUN: 15 mg/dL (ref 6–20)
CO2: 23 mmol/L (ref 22–32)
Calcium: 8.9 mg/dL (ref 8.9–10.3)
Chloride: 106 mmol/L (ref 98–111)
Creatinine, Ser: 1.56 mg/dL — ABNORMAL HIGH (ref 0.61–1.24)
GFR, Estimated: 51 mL/min — ABNORMAL LOW (ref >60)
Glucose, Bld: 101 mg/dL — ABNORMAL HIGH (ref 70–99)
Potassium: 4.2 mmol/L (ref 3.5–5.1)
Sodium: 140 mmol/L (ref 135–145)

## 2023-11-28 MED ORDER — PREDNISONE 10 MG PO TABS: ORAL_TABLET | ORAL | 0 refills | Status: AC

## 2023-11-28 MED ORDER — DEXAMETHASONE SODIUM PHOSPHATE 10 MG/ML IJ SOLN
10.0000 mg | Freq: Once | INTRAMUSCULAR | Status: AC
  Administered 2023-11-28: 10 mg via INTRAVENOUS
  Filled 2023-11-28: qty 1

## 2023-11-28 MED ORDER — FAMOTIDINE IN NACL 20-0.9 MG/50ML-% IV SOLN
20.0000 mg | Freq: Once | INTRAVENOUS | Status: AC
  Administered 2023-11-28: 20 mg via INTRAVENOUS
  Filled 2023-11-28: qty 50

## 2023-11-28 MED ORDER — DIPHENHYDRAMINE HCL 50 MG/ML IJ SOLN
50.0000 mg | INTRAMUSCULAR | Status: AC
  Administered 2023-11-28: 50 mg via INTRAVENOUS
  Filled 2023-11-28: qty 1

## 2023-11-28 MED ORDER — ONDANSETRON HCL 4 MG/2ML IJ SOLN
4.0000 mg | INTRAMUSCULAR | Status: DC
  Filled 2023-11-28: qty 2

## 2023-11-28 MED ORDER — TRANEXAMIC ACID-NACL 1000-0.7 MG/100ML-% IV SOLN
1000.0000 mg | INTRAVENOUS | Status: AC
  Administered 2023-11-28: 1000 mg via INTRAVENOUS
  Filled 2023-11-28: qty 100

## 2023-11-28 MED ORDER — EPINEPHRINE 0.3 MG/0.3ML IJ SOAJ: 0.3000 mg | Freq: Once | INTRAMUSCULAR | 0 refills | Status: AC

## 2023-11-28 NOTE — Discharge Instructions (Signed)
 As we discussed, multiple things can cause angioedema (facial swelling), but it is often unclear what exactly is the cause for any particular person, and that is the case for you.  Sometimes it just happens.  It is unlikely to be the result of your antibiotics, so we encourage you to finish your course of antibiotics.  If you want to be as conservative as possible, you can discontinue the use of your losartan  blood pressure medicine but continue taking your amlodipine .  You can follow-up with your primary care doctor at the next available opportunity.  We also recommend you call the office of Dr. Silvestre Drum to schedule a follow-up appointment with him or one of his colleagues; when you call, let them know you were seen in the emergency department for angioedema and you would like to schedule a follow-up appointment.  Please keep the prescribed EpiPen  with you at all times and use it if you feel that you are short of breath or you are having worsening symptoms.  Please complete the prednisone  taper that you were prescribed as well.  Return immediately to the emergency department if you develop new or worsening symptoms that concern you.

## 2023-11-28 NOTE — ED Triage Notes (Signed)
 Pt reports he woke up this morning with left side lip swelling. Pt reports he has multiple allergies but has not come into contact with any known. Pt states he had no swelling prior to bed. Pt was dx with PNA and is about to finish course of Amox-Clav and zpack. Pt also reports he does take a BP medication but cannot recall the name. Pt denies thorat irritation or swelling.

## 2023-11-28 NOTE — ED Provider Notes (Signed)
 [facility] Provider Note    Event Date/Time   First MD Initiated Contact with Patient 11/28/23 [phone]     (approximate)   History   Oral Swelling   HPI Dillon WADEL is a 63 y.o. male who presents for evaluation of left-sided lip swelling.  He said he was totally normal when he went to bed at 10 PM.  He woke up just prior to arrival with an unusual sensation to the left side of his face.  He confirmed in the mirror that his left upper and lower lips are swollen.  No obvious associated facial edema.  He is not having any difficulty swallowing or speaking.  He has a few allergies but he has never had a reaction like this.  He takes amlodipine  and losartan  and has been on the medications for quite a long time.  He is currently taking Augmentin  and azithromycin for recently diagnosed community-acquired pneumonia.  He has been on amoxicillin  previously without any reaction.  He has no shortness of breath, itching, swelling other than as described above.  No rash.     Physical Exam   Triage Vital Signs: ED Triage Vitals [11/28/23 0320]  Encounter Vitals Group     BP (!) 126/90     Systolic BP Percentile      Diastolic BP Percentile      Pulse Rate 86     Resp 18     Temp 98.5 F (36.9 C)     Temp src      SpO2 99 %     Weight 83.9 kg (185 lb)     Height 1.778 m (5\' 10" )     Head Circumference      Peak Flow      Pain Score      Pain Loc      Pain Education      Exclude from Growth Chart     Most recent vital signs: Vitals:   11/28/23 0400 11/28/23 0430  BP: 121/88 115/75  Pulse: 81 72  Resp:    Temp:    SpO2: 98% 99%    General: Awake, no obvious distress.  Alert and oriented. CV:  Good peripheral perfusion.  Regular rate and rhythm. Resp:  Normal effort. Speaking easily and comfortably, no accessory muscle usage nor intercostal retractions.  No wheezing nor stridor. Abd:  No distention.  Other:  Patient has obvious moderate severity  swelling of the left side of his upper and lower lips.  There is a clear line of demarcation in the center of his lips with the angioedema only occurring on the left side.  There is no swelling nor tenderness nor erythema to the side of his face including in the area that he previously reports having had a dental abscess..  His oropharynx is clear and patent and nonedematous.  No swelling of the tongue.  No trismus.  No evidence of acute dental infection at this time.  No submandibular swelling or induration.   ED Results / Procedures / Treatments   Labs (all labs ordered are listed, but only abnormal results are displayed) Labs Reviewed  CBC WITH DIFFERENTIAL/PLATELET - Abnormal; Notable for the following components:      Result Value   HCT 38.9 (*)    Platelets 544 (*)    All other components within normal limits  BASIC METABOLIC PANEL WITH GFR - Abnormal; Notable for the following components:   Glucose, Bld 101 (*)  Creatinine, Ser 1.56 (*)    GFR, Estimated 51 (*)    All other components within normal limits      RADIOLOGY See ED course for any details about radiology studies   PROCEDURES:  Critical Care performed: No  Procedures    IMPRESSION / MDM / ASSESSMENT AND PLAN / ED COURSE  I reviewed the triage vital signs and the nursing notes.                              Differential diagnosis includes, but is not limited to, idiopathic angioedema, ARB induced angioedema, hereditary angioedema, allergic angioedema, anaphylaxis.  Patient's presentation is most consistent with acute presentation with potential threat to life or bodily function.  Labs/studies ordered: BMP, CBC with differential  Interventions/Medications given:  Medications  ondansetron  (ZOFRAN ) injection 4 mg (4 mg Intravenous Patient Refused/Not Given 11/28/23 0459)  tranexamic acid (CYKLOKAPRON) IVPB 1,000 mg (0 mg Intravenous Stopped 11/28/23 0450)  famotidine (PEPCID) IVPB 20 mg premix (0 mg  Intravenous Stopped 11/28/23 0429)  diphenhydrAMINE  (BENADRYL ) injection 50 mg (50 mg Intravenous Given 11/28/23 0353)  dexamethasone  (DECADRON ) injection 10 mg (10 mg Intravenous Given 11/28/23 0355)    (Note:  hospital course my include additional interventions and/or labs/studies not listed above.)   Patient is hemodynamically stable and in no distress currently.  No family or personal history of angioedema to suggest a hereditary component.  I checked his medical record and verified that he is not taking an ACE inhibitor.  He is not having any other allergic signs or symptoms and is not exhibiting signs of anaphylaxis.  I had my usual angioedema discussion with the patient.  No indication for emergent airway at this time.  I do not feel he would benefit from epinephrine  at this point but I will treat empirically with famotidine 20 mg IV, diphenhydramine  50 mg IV, and TXA 1000 mg IV to be administered over 10 minutes.  Also ordering dexamethasone  10 mg IV.  We will monitor him at least for a few hours to make sure he is not getting worse and hopefully will improve after medications.  I will escalate to epinephrine  injection if his symptoms begin to worsen or if he develops new symptoms.  He understands and agrees with the plan.     Clinical Course as of 11/28/23 0547  Wed Nov 28, 2023  0410 Basic metabolic panel(!) Stable chronic kidney disease, no significant or acute abnormalities on BMP or CBC [CF]  [phone] Patient reported some nausea and I ordered Zofran , but he reports that the nausea was short lived and he did not need the medicine. [CF]  0542 I reassessed the patient.  It appears that the angioedema has gone down slightly although it is still present but less obvious than it was before.  He continues to have no tenderness to palpation of the side of his face and he has no allergic signs or symptoms.  Airway is still fully patent.  I talked with him about additional observation versus  discharge home.  I explained that I believe he is safe even though we do not know exactly what caused the angioedema.  We had a long discussion about his medications and since this is very unlikely to be an allergic reaction to his antibiotics, I feel that the risk of discontinuing his antibiotics for community-acquired pneumonia is greater than the risk of allergic angioedema, so I encouraged him to finish the  medication.  Similarly, I told him I felt it was very unlikely that the angioedema was the result of his losartan , but if he wanted to be conservative, he could discontinue the losartan , continue using his amlodipine .  Either way, I encouraged him to follow-up with his PCP at the next available opportunity.  I also provided him with the name and phone number for Dr. 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  He understands and agrees with the plan.  Medications prescribed are listed below including an EpiPen .  He is familiar with their use and understands why he may want to use them and keep them with him at all times.  I gave my usual return precautions. [CF]    Clinical Course User Index [CF] Lynnda Sas, MD     FINAL CLINICAL IMPRESSION(S) / ED DIAGNOSES   Final diagnoses:  Angioedema, initial encounter     Rx / DC Orders   ED Discharge Orders          Ordered    predniSONE  (DELTASONE ) 10 MG tablet        11/28/23 0547    EPINEPHrine  (EPIPEN  2-PAK) 0.3 mg/0.3 mL IJ SOAJ injection   Once        11/28/23 0547             Note:  This document was prepared using Dragon voice recognition software and may include unintentional dictation errors.   Lynnda Sas, MD 11/28/23 [phone]

## 2023-11-29 ENCOUNTER — Ambulatory Visit (INDEPENDENT_AMBULATORY_CARE_PROVIDER_SITE_OTHER): Admitting: Family Medicine

## 2023-11-29 VITALS — BP 128/70 | HR 79 | Temp 98.4°F | Resp 15 | Ht 70.0 in | Wt 189.8 lb

## 2023-11-29 DIAGNOSIS — F439 Reaction to severe stress, unspecified: Secondary | ICD-10-CM | POA: Diagnosis not present

## 2023-11-29 DIAGNOSIS — J189 Pneumonia, unspecified organism: Secondary | ICD-10-CM

## 2023-11-29 DIAGNOSIS — R7989 Other specified abnormal findings of blood chemistry: Secondary | ICD-10-CM | POA: Diagnosis not present

## 2023-11-29 DIAGNOSIS — T783XXD Angioneurotic edema, subsequent encounter: Secondary | ICD-10-CM

## 2023-11-29 NOTE — Progress Notes (Signed)
 Subjective:  Patient ID: Dillon Reeves, male    DOB: 10-24-1960  Age: 63 y.o. MRN: [id number]  CC:  Chief Complaint  Patient presents with   Hospitalization Follow-up    Pt notes he is feeling better since hospital visit notes he has felt colder overall since but no other concerns      HPI [name] presents for   Angioedema, ER follow-up ER notes reviewed from 11/28/2023. Presented for left-sided lip swelling, normal state of health night prior when he went to bed, woke up with an unusual sensation to the left side of his face.  Left upper and lower lips were swollen without obvious facial edema.  No trouble swallowing or speaking.  No prior similar symptoms.  On amlodipine , losartan  for blood pressure and had been started on Augmentin  and azithromycin  for recently diagnosed commune acquired pneumonia.  Has taken amoxicillin  previously without reaction.   BP 126/90, temp 98.5, O2 sat 99% in the ER.  Moderate severity swelling of the left side of his upper and lower lips.  Clear demarcation in the center with angioedema only occurring on the left side.No swelling, tenderness or erythema to the side of his face including area that previously reported having a dental abscess.  Oropharynx was clear, patent and not edematous.  No tongue swelling, no evidence of acute dental infection at that time or submandibular swelling/induration.  CBC notable for platelets of 544, hematocrit 38.9 but otherwise normal.  Creatinine 1.56, glucose 101.   Treated with Decadron , Benadryl , Pepcid .  No airway compromise, epinephrine  was deferred.  Monitored in the ER with some improvement.  Unknown specific cause of angioedema.  Decided against discontinuing his antibiotics for the community-acquired pneumonia as less likely cause and potentially more risk of discontinuation.  Possibly idiopathic, possibly losartan /ARB cause but recommended discussion with PCP and possible follow-up with ENT/allergy.   EpiPen  was prescribed if needed. Prednisone  taper given to fill if needed.  Swelling improved, not resolved.  No difficulty swallowing, mouth swelling or difficulty swallowing.   Previously was evaluated by my colleague Dr. Gayl Katos on May 22, diagnosed with community-acquired pneumonia with right upper lobe infiltrate.  Started on Augmentin , azithromycin , 7-day course for Augmentin . I more dose of augmentin  tonight.   In 1 year ago 1.40, then 1.54 8 months ago, 1.55 7 days ago.   No fevers in past 3-4 days.  Not coughing.   Stress with going through divorce, medical bills.  Depressing at times. Denies SI/HI.  Staying busy, exercise has helped manage stress prior, riding motorcycle.     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  Started Rifampin 08/2016. To complete 4 months of therapy.     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, asymptomatic   Past Surgical History:  Procedure Laterality Date   LUMBAR LAMINECTOMY/DECOMPRESSION MICRODISCECTOMY N/A 03/20/2018   Procedure: LEFT LUMBAR 5- SACRUM 1 MICRODISECTOMY;  Surgeon: Virl Grimes, MD;  Location: [facility] OR;  Service: Orthopedics;  Laterality: N/A;   OPEN REDUCTION  INTERNAL FIXATION (ORIF) HAND Right 01/29/2015   Procedure: OPEN TREATMENT RIGHT FIFTH METACARPAL FRACTURE;  Surgeon: Rober Chimera, MD;  Location: [facility];  Service: Orthopedics;  Laterality: Right;   Allergies  Allergen Reactions   Fish-Derived Products Anaphylaxis    All Sea Food.   Other Anaphylaxis   Peanut-Containing Drug Products Anaphylaxis    Prior to Admission medications   Medication Sig Start Date End Date Taking? Authorizing Provider  amLODipine  (NORVASC ) 10 MG tablet TAKE 1 TABLET(10 MG) BY MOUTH DAILY 07/23/23  Yes Benjiman Bras, MD  amoxicillin -clavulanate (AUGMENTIN ) 875-125 MG tablet Take 1 tablet by mouth 2 (two) times daily for 7 days. 11/22/23 11/29/23 Yes Ether Hercules, MD  loratadine (CLARITIN) 10 MG tablet Take 10 mg by mouth 1 day or 1 dose.   Yes [provider]  losartan  (COZAAR ) 25 MG tablet TAKE 1 TABLET(25 MG) BY MOUTH DAILY 08/31/23  Yes Benjiman Bras, MD  omeprazole  (PRILOSEC) 20 MG capsule Take 1 capsule (20 mg total) by mouth daily. 02/01/23  Yes Benjiman Bras, MD  predniSONE  (DELTASONE ) 10 MG tablet Take 4 tabs (40 mg) PO x 2 days, then take 2 tabs (20 mg) PO x 2 days, then take 1 tab (10 mg) PO x 2 days, then take 1/2 tab (5 mg) PO x 2 days. 11/28/23  Yes Lynnda Sas, MD  rosuvastatin  (CRESTOR ) 10 MG tablet Take 1 tablet (10 mg total) by mouth daily. 03/07/23  Yes Benjiman Bras, MD  sildenafil  (REVATIO ) 20 MG tablet TAKE 1-3 TABLETS BY MOUTH PRIOR TO INTERCOURSE AS NEEDED 09/17/23  Yes Benjiman Bras, MD   Social History   Socioeconomic History   Marital status: Married    Spouse name: separated   Number of children: 3   Years of education: 2+ years college   Highest education level: Not on file  Occupational History   Occupation: delivery truck Air traffic controller: [employer]   Occupation: bus driver    Employer: [employer]  Tobacco Use   Smoking status: Never   Smokeless tobacco: Never  Vaping Use   Vaping status: Never Used  Substance and Sexual Activity   Alcohol use: Yes    Comment: once or twice every two weeks varies   Drug use: No   Sexual activity: Yes  Other Topics Concern   Not on file  Social History Narrative   Lives with his wife and their children.   Separating from wife (08/2016).   Social Drivers of Research scientist (physical sciences) Strain: Not on file  Food Insecurity: Not on file  Transportation Needs: Not on file  Physical Activity: Not on file  Stress: Not on file  Social Connections: Unknown (11/14/2021)   Received from [facility], [facility]   Social Network    Social Network: Not on file  Intimate Partner Violence: Unknown (10/06/2021)   Received from [facility], [facility]   HITS    Physically Hurt: Not on file    Insult or Talk Down To: Not on file    Threaten Physical Harm: Not on file    Scream or Curse: Not on file    Review of Systems Per HPI.   Objective:   Vitals:   11/29/23 1100  BP: 128/70  Pulse: 79  Resp: 15  Temp: 98.4 F (36.9 C)  TempSrc: Temporal  SpO2: 98%  Weight: 189 lb 12.8 oz (86.1 kg)  Height: 5\' 10"  (1.778  m)     Physical Exam Vitals reviewed.  Constitutional:      Appearance: He is well-developed.  HENT:     Head: Normocephalic and atraumatic.  Neck:     Vascular: No carotid bruit or JVD.  Cardiovascular:     Rate and Rhythm: Normal rate and regular rhythm.     Heart sounds: Normal heart sounds. No murmur heard. Pulmonary:     Effort: Pulmonary effort is normal.     Breath sounds: Normal breath sounds. No rales.  Musculoskeletal:     Right lower leg: No edema.     Left lower leg: No edema.  Skin:    General: Skin is warm and dry.  Neurological:     Mental Status: He is alert and oriented to person, place, and time.  Psychiatric:        Mood and Affect: Mood normal.       49 minutes spent during visit, including chart review, prior note and ER record review, counseling and assimilation of information, exam, discussion of plan, and chart completion.    Assessment & Plan:  STEPHAN NELIS is a 63 y.o. male . Angioedema, subsequent encounter - Plan: Ambulatory referral to Allergy  - Improving.  No posterior oropharynx or tongue involvement.  No respiratory symptoms.  ER treatment as above.  Idiopathic versus less likely  amlodipine  or losartan , and less likely due to antibiotics, but he is off azithromycin  and finishing Augmentin .  - Will hold losartan  temporarily, close monitoring of blood pressure.   -Referred to allergist for consideration of other testing and recommendations.  ER precautions given if any worsening symptoms or recurrence.  Situational stress  - Handout given on stress and stress management.  Discussed coping techniques and RTC precautions.  Pneumonia of right upper lobe due to infectious organism - Plan: DG Chest 2 View  - Clinically improved, lungs clear.  Plan for repeat x-ray next few weeks, finishing up antibiotic.  RTC precautions.  Elevated serum creatinine - Plan: Basic metabolic panel with GFR .  Check updated labs with repeat creatinine at time of x-ray.  No orders of the defined types were placed in this encounter.  Patient Instructions  Stop losartan  for now.  Keep a record of your blood pressures outside of the office and if over 140/90 let me know and we can add another med.  I will refer you to allergist to discuss other testing for the angioedema.  As long as you continue to improve, have chest xray and repeat bloodwork in 1 month. Be seen if any new/worsening symptoms.  [facility] or xray: Walk in 8:30-4:30 during weekdays, no appointment needed [address]  [place], [place] [address]  See info below on stress - let me know if I can help further.   Stress, Adult Stress is a normal reaction to life events. Stress is what you feel when life demands more than you are used to, or more than you think you can handle. Some stress can be useful, such as studying for a test or meeting a deadline at work. Stress that occurs too often or for too long can cause problems. Long-lasting stress is called chronic stress. Chronic stress can affect your emotional health and interfere with relationships and normal daily activities. Too much stress can weaken your body's defense  system (immune system) and increase your risk for physical illness. If you already have a medical problem, stress can make it worse. What are the causes?  All sorts of life events can cause stress. An event that causes stress for one person may not be stressful for someone else. Major life events, whether positive or negative, commonly cause stress. Examples include: Losing a job or starting a new job. Losing a loved one. Moving to a new town or home. Getting married or divorced. Having a baby. Getting injured or sick. Less obvious life events can also cause stress, especially if they occur day after day or in combination with each other. Examples include: Working long hours. Driving in traffic. Caring for children. Being in debt. Being in a difficult relationship. What are the signs or symptoms? Stress can cause emotional and physical symptoms and can lead to unhealthy behaviors. These include the following: Emotional symptoms Anxiety. This is feeling worried, afraid, on edge, overwhelmed, or out of control. Anger, including irritation or impatience. Depression. This is feeling sad, down, helpless, or guilty. Trouble focusing, remembering, or making decisions. Physical symptoms Aches and pains. These may affect your head, neck, back, stomach, or other areas of your body. Tight muscles or a clenched jaw. Low energy. Trouble sleeping. Unhealthy behaviors Eating to feel better (overeating) or skipping meals. Working too much or putting off tasks. Smoking, drinking alcohol, or using drugs to feel better. How is this diagnosed? A stress disorder is diagnosed through an assessment by your health care provider. A stress disorder may be diagnosed based on: Your symptoms and any stressful life events. Your medical history. Tests to rule out other causes of your symptoms. Depending on your condition, your health care provider may refer you to a specialist for further evaluation. How is  this treated?  Stress management techniques are the recommended treatment for stress. Medicine is not typically recommended for treating stress. Techniques to reduce your reaction to stressful life events include: Identifying stress. Monitor yourself for symptoms of stress and notice what causes stress for you. These skills may help you to avoid or prepare for stressful events. Managing time. Set your priorities, keep a calendar of events, and learn to say no. These actions can help you avoid taking on too much. Techniques for dealing with stress include: Rethinking the problem. Try to think realistically about stressful events rather than ignoring them or overreacting. Try to find the positives in a stressful situation rather than focusing on the negatives. Exercise. Physical exercise can release both physical and emotional tension. The key is to find a form of exercise that you enjoy and do it regularly. Relaxation techniques. These relax the body and mind. Find one or more that you enjoy and use the techniques regularly. Examples include: Meditation, deep breathing, or progressive relaxation techniques. Yoga or tai chi. Biofeedback, mindfulness techniques, or journaling. Listening to music, being in nature, or taking part in other hobbies. Practicing a healthy lifestyle. Eat a balanced diet, drink plenty of water, limit or avoid caffeine, and get plenty of sleep. Having a strong support network. Spend time with family, friends, or other people you enjoy being around. Express your feelings and talk things over with someone you trust. Counseling or talk therapy with a mental health provider may help if you are having trouble managing stress by yourself. Follow these instructions at home: Lifestyle  Avoid drugs. Do not use any products that contain nicotine or tobacco. These products include cigarettes, chewing tobacco, and vaping devices, such as e-cigarettes. If you need help quitting, ask  your health care provider. If you drink alcohol: Limit how much you have to: 0-1 drink  a day for women who are not pregnant. 0-2 drinks a day for men. Know how much alcohol is in a drink. In the [place] one drink equals one 12 oz bottle of beer (355 mL), one 5 oz glass of wine (148 mL), or one 1 oz glass of hard liquor (44 mL). Do not use alcohol or drugs to relax. Eat a balanced diet that includes fresh fruits and vegetables, whole grains, lean meats, fish, eggs, beans, and low-fat dairy. Avoid processed foods and foods high in added fat, sugar, and salt. Exercise at least 30 minutes on 5 or more days each week. Get 7-8 hours of sleep each night. General instructions  Practice stress management techniques as told by your health care provider. Drink enough fluid to keep your urine pale yellow. Take over-the-counter and prescription medicines only as told by your health care provider. Keep all follow-up visits. This is important. Contact a health care provider if: Your symptoms get worse. You have new symptoms. You feel overwhelmed by your problems and can no longer manage them by yourself. Get help right away if: You have thoughts of hurting yourself or others. Get help right awayif you feel like you may hurt yourself or others, or have thoughts about taking your own life. Go to your nearest emergency room or: Call 911. Call the National Suicide Prevention Lifeline at (907)007-0863 or 988. This is open 24 hours a day. Text the Crisis Text Line at (678)289-6547. Summary Stress is a normal reaction to life events. It can cause problems if it happens too often or for too long. Practicing stress management techniques is the best way to treat stress. Counseling or talk therapy with a mental health provider may help if you are having trouble managing stress by yourself. This information is not intended to replace advice given to you by your health care provider. Make sure you discuss any questions  you have with your health care provider. Document Revised: 01/27/2021 Document Reviewed: 01/27/2021 Elsevier Patient Education  2024 Elsevier Inc.    Signed,   Caro Christmas, MD [facility], [facility] 11/29/23 12:14 PM

## 2023-11-29 NOTE — Patient Instructions (Addendum)
 Stop losartan  for now.  Keep a record of your blood pressures outside of the office and if over 140/90 let me know and we can add another med.  I will refer you to allergist to discuss other testing for the angioedema.  As long as you continue to improve, have chest xray and repeat bloodwork in 1 month. Be seen if any new/worsening symptoms.  [facility] or xray: Walk in 8:30-4:30 during weekdays, no appointment needed [address]  [place], [place] [address]  See info below on stress - let me know if I can help further.   Stress, Adult Stress is a normal reaction to life events. Stress is what you feel when life demands more than you are used to, or more than you think you can handle. Some stress can be useful, such as studying for a test or meeting a deadline at work. Stress that occurs too often or for too long can cause problems. Long-lasting stress is called chronic stress. Chronic stress can affect your emotional health and interfere with relationships and normal daily activities. Too much stress can weaken your body's defense system (immune system) and increase your risk for physical illness. If you already have a medical problem, stress can make it worse. What are the causes? All sorts of life events can cause stress. An event that causes stress for one person may not be stressful for someone else. Major life events, whether positive or negative, commonly cause stress. Examples include: Losing a job or starting a new job. Losing a loved one. Moving to a new town or home. Getting married or divorced. Having a baby. Getting injured or sick. Less obvious life events can also cause stress, especially if they occur day after day or in combination with each other. Examples include: Working long hours. Driving in traffic. Caring for children. Being in debt. Being in a difficult relationship. What are the signs or symptoms? Stress can cause emotional and physical symptoms and can  lead to unhealthy behaviors. These include the following: Emotional symptoms Anxiety. This is feeling worried, afraid, on edge, overwhelmed, or out of control. Anger, including irritation or impatience. Depression. This is feeling sad, down, helpless, or guilty. Trouble focusing, remembering, or making decisions. Physical symptoms Aches and pains. These may affect your head, neck, back, stomach, or other areas of your body. Tight muscles or a clenched jaw. Low energy. Trouble sleeping. Unhealthy behaviors Eating to feel better (overeating) or skipping meals. Working too much or putting off tasks. Smoking, drinking alcohol, or using drugs to feel better. How is this diagnosed? A stress disorder is diagnosed through an assessment by your health care provider. A stress disorder may be diagnosed based on: Your symptoms and any stressful life events. Your medical history. Tests to rule out other causes of your symptoms. Depending on your condition, your health care provider may refer you to a specialist for further evaluation. How is this treated?  Stress management techniques are the recommended treatment for stress. Medicine is not typically recommended for treating stress. Techniques to reduce your reaction to stressful life events include: Identifying stress. Monitor yourself for symptoms of stress and notice what causes stress for you. These skills may help you to avoid or prepare for stressful events. Managing time. Set your priorities, keep a calendar of events, and learn to say no. These actions can help you avoid taking on too much. Techniques for dealing with stress include: Rethinking the problem. Try to think realistically about stressful  events rather than ignoring them or overreacting. Try to find the positives in a stressful situation rather than focusing on the negatives. Exercise. Physical exercise can release both physical and emotional tension. The key is to find a form of  exercise that you enjoy and do it regularly. Relaxation techniques. These relax the body and mind. Find one or more that you enjoy and use the techniques regularly. Examples include: Meditation, deep breathing, or progressive relaxation techniques. Yoga or tai chi. Biofeedback, mindfulness techniques, or journaling. Listening to music, being in nature, or taking part in other hobbies. Practicing a healthy lifestyle. Eat a balanced diet, drink plenty of water, limit or avoid caffeine, and get plenty of sleep. Having a strong support network. Spend time with family, friends, or other people you enjoy being around. Express your feelings and talk things over with someone you trust. Counseling or talk therapy with a mental health provider may help if you are having trouble managing stress by yourself. Follow these instructions at home: Lifestyle  Avoid drugs. Do not use any products that contain nicotine or tobacco. These products include cigarettes, chewing tobacco, and vaping devices, such as e-cigarettes. If you need help quitting, ask your health care provider. If you drink alcohol: Limit how much you have to: 0-1 drink a day for women who are not pregnant. 0-2 drinks a day for men. Know how much alcohol is in a drink. In the [place] one drink equals one 12 oz bottle of beer (355 mL), one 5 oz glass of wine (148 mL), or one 1 oz glass of hard liquor (44 mL). Do not use alcohol or drugs to relax. Eat a balanced diet that includes fresh fruits and vegetables, whole grains, lean meats, fish, eggs, beans, and low-fat dairy. Avoid processed foods and foods high in added fat, sugar, and salt. Exercise at least 30 minutes on 5 or more days each week. Get 7-8 hours of sleep each night. General instructions  Practice stress management techniques as told by your health care provider. Drink enough fluid to keep your urine pale yellow. Take over-the-counter and prescription medicines only as told by  your health care provider. Keep all follow-up visits. This is important. Contact a health care provider if: Your symptoms get worse. You have new symptoms. You feel overwhelmed by your problems and can no longer manage them by yourself. Get help right away if: You have thoughts of hurting yourself or others. Get help right awayif you feel like you may hurt yourself or others, or have thoughts about taking your own life. Go to your nearest emergency room or: Call 911. Call the National Suicide Prevention Lifeline at 5012280194 or 988. This is open 24 hours a day. Text the Crisis Text Line at 351-363-7894. Summary Stress is a normal reaction to life events. It can cause problems if it happens too often or for too long. Practicing stress management techniques is the best way to treat stress. Counseling or talk therapy with a mental health provider may help if you are having trouble managing stress by yourself. This information is not intended to replace advice given to you by your health care provider. Make sure you discuss any questions you have with your health care provider. Document Revised: 01/27/2021 Document Reviewed: 01/27/2021 Elsevier Patient Education  2024 [employer].

## 2023-12-01 ENCOUNTER — Encounter: Payer: Self-pay | Admitting: Family Medicine

## 2024-01-22 ENCOUNTER — Other Ambulatory Visit: Payer: Self-pay | Admitting: Family Medicine

## 2024-01-22 DIAGNOSIS — I10 Essential (primary) hypertension: Secondary | ICD-10-CM

## 2024-01-22 DIAGNOSIS — K219 Gastro-esophageal reflux disease without esophagitis: Secondary | ICD-10-CM

## 2024-01-22 MED ORDER — AMLODIPINE BESYLATE 10 MG PO TABS
ORAL_TABLET | ORAL | 1 refills | Status: DC
Start: 2024-01-22 — End: 2024-03-06

## 2024-01-22 MED ORDER — OMEPRAZOLE 20 MG PO CPDR: 20.0000 mg | DELAYED_RELEASE_CAPSULE | Freq: Every day | ORAL | 3 refills | Status: AC

## 2024-01-22 NOTE — Telephone Encounter (Signed)
 Copied from CRM [id number]. Topic: Clinical - Medication Refill >> Jan 22, 2024  9:38 AM Lavanda D wrote: Medication: omeprazole  (PRILOSEC) 20 MG capsule amLODipine  (NORVASC ) 10 MG tablet  Has the patient contacted their pharmacy? No, 0 refills (Agent: If no, request that the patient contact the pharmacy for the refill. If patient does not wish to contact the pharmacy document the reason why and proceed with request.) (Agent: If yes, when and what did the pharmacy advise?)  This is the patient's preferred pharmacy:  [id number] - [place], [place] - [address] AT [facility] OF [address] [place] [address] Phone: [phone] Fax: [phone]   Is this the correct pharmacy for this prescription? Yes If no, delete pharmacy and type the correct one.   Has the prescription been filled recently? Yes  Is the patient out of the medication? Yes  Has the patient been seen for an appointment in the last year OR does the patient have an upcoming appointment? No  Can we respond through MyChart? No  Agent: Please be advised that Rx refills may take up to 3 business days. We ask that you follow-up with your pharmacy.

## 2024-01-24 ENCOUNTER — Other Ambulatory Visit: Payer: Self-pay | Admitting: Family Medicine

## 2024-01-24 DIAGNOSIS — K219 Gastro-esophageal reflux disease without esophagitis: Secondary | ICD-10-CM

## 2024-02-23 ENCOUNTER — Other Ambulatory Visit: Payer: Self-pay | Admitting: Family Medicine

## 2024-03-06 ENCOUNTER — Ambulatory Visit: Admitting: Family Medicine

## 2024-03-06 ENCOUNTER — Encounter: Payer: Self-pay | Admitting: Family Medicine

## 2024-03-06 VITALS — BP 118/70 | HR 64 | Temp 98.0°F | Resp 15 | Ht 70.0 in | Wt 187.4 lb

## 2024-03-06 DIAGNOSIS — I10 Essential (primary) hypertension: Secondary | ICD-10-CM | POA: Diagnosis not present

## 2024-03-06 DIAGNOSIS — R7303 Prediabetes: Secondary | ICD-10-CM

## 2024-03-06 DIAGNOSIS — E782 Mixed hyperlipidemia: Secondary | ICD-10-CM | POA: Diagnosis not present

## 2024-03-06 DIAGNOSIS — J189 Pneumonia, unspecified organism: Secondary | ICD-10-CM | POA: Diagnosis not present

## 2024-03-06 DIAGNOSIS — Z23 Encounter for immunization: Secondary | ICD-10-CM

## 2024-03-06 LAB — LIPID PANEL
Cholesterol: 169 mg/dL (ref 0–200)
HDL: 90.6 mg/dL (ref >39.00)
LDL Cholesterol: 68 mg/dL (ref 0–99)
NonHDL: 78.48
Total CHOL/HDL Ratio: 2
Triglycerides: 54 mg/dL (ref 0.0–149.0)
VLDL: 10.8 mg/dL (ref 0.0–40.0)

## 2024-03-06 LAB — COMPREHENSIVE METABOLIC PANEL WITH GFR
ALT: 17 U/L (ref 0–53)
AST: 20 U/L (ref 0–37)
Albumin: 4.5 g/dL (ref 3.5–5.2)
Alkaline Phosphatase: 46 U/L (ref 39–117)
BUN: 13 mg/dL (ref 6–23)
CO2: 28 meq/L (ref 19–32)
Calcium: 9.2 mg/dL (ref 8.4–10.5)
Chloride: 103 meq/L (ref 96–112)
Creatinine, Ser: 1.37 mg/dL (ref 0.40–1.50)
GFR: 55.89 mL/min — ABNORMAL LOW (ref >60.00)
Glucose, Bld: 75 mg/dL (ref 70–99)
Potassium: 3.9 meq/L (ref 3.5–5.1)
Sodium: 141 meq/L (ref 135–145)
Total Bilirubin: 0.5 mg/dL (ref 0.2–1.2)
Total Protein: 7.5 g/dL (ref 6.0–8.3)

## 2024-03-06 LAB — HEMOGLOBIN A1C: Hgb A1c MFr Bld: 6.2 % (ref 4.6–6.5)

## 2024-03-06 MED ORDER — ROSUVASTATIN CALCIUM 10 MG PO TABS: 10.0000 mg | ORAL_TABLET | Freq: Every day | ORAL | 3 refills | Status: DC

## 2024-03-06 MED ORDER — AMLODIPINE BESYLATE 10 MG PO TABS: ORAL_TABLET | ORAL | 3 refills | Status: AC

## 2024-03-06 NOTE — Progress Notes (Signed)
 Subjective:  Patient ID: Dillon Reeves, male    DOB: 16-Jul-1960  Age: 63 y.o. MRN: [id number]  CC:  Chief Complaint  Patient presents with   Medical Management of Chronic Issues    Pt is doing well, no concerns,     HPI Debbie Bellucci Reliford presents for follow up.  Some increased allergies with ragweed. Claritin otc. No fever.   Community-acquired pneumonia Treated for right upper lobe pneumonia in May.  Has not had repeat chest x-ray.  Denies recent cough, dyspnea or recurrence of  PNA symptoms.  Angioedema Evaluated May 29.  Had recently started Augmentin , azithromycin  for his pneumonia, had taken amoxicillin  previously without reaction.  Had been on amlodipine  and losartan  for hypertension.  Treated in ER.  Possible idiopathic versus losartan /ARB cause.  On breathing at the time of his visit with me on May 29.  Losartan  was temporarily held with close monitoring of blood pressure recommended.  Referred to allergist.  Appears they tried to contact patient multiple times, and then referral closed. No return of face swelling.  Busy and other bills at the time interfered with allergy follow up. Would like to monitor for recurrence at this time then see allergist if needed.   Hypertension: Amlodipine  10 mg daily, losartan  added last September for elevated readings.  Off that medication with angioedema as above.  Elevated creatinine noted previously, stage III CKD versus elevated creatinine with muscle mass.  Prior nephrology evaluation. Nov 08, 2022.  Dr. Marlee.  Unclear if CKD at that time, thought to be related to higher muscle mass.  Cystatin C was ordered to obtain a more accurate estimation of his GFR.  Cystatin C was 1.0 with creatinine 1.42 on 11/06/2022.  GFR estimate using his Cystatin C was 70 mL/min, very mild CKD, plan annual monitoring with either nephrology or PCP. No new med side effects.  Minimal lightheadedness if standing quickly after prolonged seated. No  syncope/near syncope.   Home readings: 120/70-80.  BP Readings from Last 3 Encounters:  03/06/24 118/70  11/29/23 128/70  11/28/23 106/81   Lab Results  Component Value Date   CREATININE 1.56 (H) 11/28/2023    Prediabetes: Weight stable, diet/exercise approach. Fast food: less. Engaged - eating more at home - fiance' has been cooking more at home. Rare soda. No sweet tea, drinking more water.  Exercise: working out 3 or more days per week 1-1.5 hrs, activity daily.  Lab Results  Component Value Date   HGBA1C 6.1 03/07/2023   Wt Readings from Last 3 Encounters:  03/06/24 187 lb 6.4 oz (85 kg)  11/29/23 189 lb 12.8 oz (86.1 kg)  11/28/23 185 lb (83.9 kg)   Hyperlipidemia: Crestor  10 mg daily without any myalgias/side effects.   Lab Results  Component Value Date   CHOL 178 03/07/2023   HDL 98.90 03/07/2023   LDLCALC 64 03/07/2023   TRIG 71.0 03/07/2023   CHOLHDL 2 03/07/2023   Lab Results  Component Value Date   ALT 36 11/22/2023   AST 30 11/22/2023   ALKPHOS 69 11/22/2023   BILITOT 0.6 11/22/2023   Immunization History  Administered Date(s) Administered   Influenza, Seasonal, Injecte, Preservative Fre 03/07/2023   Influenza,inj,Quad PF,6+ Mos 03/28/2019, 08/02/2021, 03/17/2022   Moderna Sars-Covid-2 Vaccination 09/11/2019, 10/13/2019, 06/06/2020   Zoster Recombinant(Shingrix) 01/28/2021, 08/02/2021   Flu, prevnar today. Defers tdap to next visit.    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  Started Rifampin 08/2016. To complete 4 months of therapy.     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   Other Anaphylaxis   Peanut-Containing Drug Products Anaphylaxis   Prior to Admission medications   Medication Sig Start Date End Date Taking? Authorizing Provider  amLODipine  (NORVASC ) 10 MG tablet TAKE 1 TABLET(10 MG) BY MOUTH DAILY 01/22/24  Yes Levora Reyes SAUNDERS, MD  EPINEPHrine  0.3 mg/0.3 mL IJ SOAJ injection Inject 0.3 mg into the muscle as needed for anaphylaxis. 11/28/23  Yes [provider]  loratadine (CLARITIN) 10 MG tablet Take 10 mg by mouth 1 day or 1 dose.   Yes [provider]  omeprazole  (PRILOSEC) 20 MG capsule Take 1 capsule (20 mg total) by mouth daily. 01/22/24  Yes Levora Reyes SAUNDERS, MD  predniSONE  (DELTASONE ) 10 MG tablet Take 4 tabs (40 mg) PO x 2 days, then take 2 tabs (20 mg) PO x 2 days, then take 1 tab (10 mg) PO x 2 days, then take 1/2 tab (5 mg) PO x 2 days. 11/28/23  Yes Gordan Huxley, MD  rosuvastatin  (CRESTOR ) 10 MG tablet Take 1 tablet (10 mg total) by mouth daily. 03/07/23  Yes Levora Reyes SAUNDERS, MD  sildenafil  (REVATIO ) 20 MG tablet TAKE 1-3 TABLETS BY MOUTH PRIOR TO INTERCOURSE AS NEEDED 09/17/23  Yes Levora Reyes SAUNDERS, MD   Social History   Socioeconomic History   Marital status: Married    Spouse name: separated   Number of children: 3   Years of education: 2+ years college   Highest education level: Not on file   Occupational History   Occupation: delivery truck Air traffic controller: [employer]   Occupation: bus driver    Employer: [employer]  Tobacco Use   Smoking status: Never   Smokeless tobacco: Never  Vaping Use   Vaping status: Never Used  Substance and Sexual Activity   Alcohol use: Yes    Comment: once or twice every two weeks varies   Drug use: No   Sexual activity: Yes  Other Topics Concern   Not on file  Social History Narrative   Lives with his wife and their children.   Separating from wife (08/2016).   Social Drivers of Corporate investment banker Strain: Not on file  Food Insecurity: Not on file  Transportation Needs: Not on file  Physical Activity: Not on file  Stress: Not on file  Social Connections: Unknown (11/14/2021)   [facility]   Social Network  Social Network: Not on file  Intimate Partner Violence: Unknown (10/06/2021)   Received from [facility]   HITS    Physically Hurt: Not on file    Insult or Talk Down To: Not on file    Threaten Physical Harm: Not on file    Scream or Curse: Not on file    Review of Systems  Constitutional:  Negative for fatigue and unexpected weight change.  Eyes:  Negative for visual disturbance.  Respiratory:  Negative for cough, chest tightness and shortness of breath.   Cardiovascular:  Negative for chest pain, palpitations and leg swelling.  Gastrointestinal:  Negative for abdominal pain and blood in stool.  Neurological:  Negative for dizziness, light-headedness and headaches.     Objective:   Vitals:   03/06/24 1034  BP: 118/70  Pulse: 64  Resp: 15  Temp: 98 F (36.7 C)  TempSrc: Temporal  SpO2: 99%  Weight: 187 lb 6.4 oz (85 kg)  Height: 5' 10 (1.778 m)     Physical Exam Vitals reviewed.  Constitutional:      Appearance: He is well-developed.  HENT:     Head: Normocephalic and atraumatic.  Neck:     Vascular: No carotid bruit or JVD.  Cardiovascular:     Rate  and Rhythm: Normal rate and regular rhythm.     Heart sounds: Normal heart sounds. No murmur heard. Pulmonary:     Effort: Pulmonary effort is normal.     Breath sounds: Normal breath sounds. No rales.  Musculoskeletal:     Right lower leg: No edema.     Left lower leg: No edema.  Skin:    General: Skin is warm and dry.  Neurological:     Mental Status: He is alert and oriented to person, place, and time.  Psychiatric:        Mood and Affect: Mood normal.        Assessment & Plan:  GLENDAL CASSADAY is a 63 y.o. male . Primary hypertension - Plan: Comprehensive metabolic panel with GFR, amLODipine  (NORVASC ) 10 MG tablet -  Stable, tolerating current regimen. Medications refilled. Labs pending as above.  Hold on losartan  for now as blood pressure stable.  Declines allergy follow-up for previous angioedema, no recurrence of symptoms.  RTC/ER precautions if any return of symptoms and at that point would stress allergy follow-up.  Continue to monitor.  - History of elevated creatinine but eGFR estimated around 70 based on Cystatin C measurement.  Continue hypertension management, hydration, avoidance of NSAIDs as possible.  Labs as above.  Need for influenza vaccination - Plan: Flu vaccine trivalent PF, 6mos and older(Flulaval,Afluria,Fluarix,Fluzone)  Need for vaccination against Streptococcus pneumoniae - Plan: Pneumococcal conjugate vaccine 20-valent (Prevnar 20)  Pneumonia of right upper lobe due to infectious organism  - Prior right upper lobe pneumonia, follow-up chest x-ray planned, discussed again today and addressed provided again.  Lungs clear on exam, asymptomatic.  Prediabetes - Plan: Hemoglobin A1c  - Check A1c, commended on dietary changes, continue to stay active.  Mixed hyperlipidemia - Plan: Comprehensive metabolic panel with GFR, Lipid panel, rosuvastatin  (CRESTOR ) 10 MG tablet  - Stable, tolerating current regimen. Medications refilled. Labs pending as above.     Meds ordered this encounter  Medications   amLODipine  (NORVASC ) 10 MG tablet    Sig: TAKE 1 TABLET(10 MG) BY MOUTH DAILY    Dispense:  90 tablet    Refill:  3   rosuvastatin  (CRESTOR ) 10 MG tablet  Sig: Take 1 tablet (10 mg total) by mouth daily.    Dispense:  90 tablet    Refill:  3   Patient Instructions   Repeat chest xray in next week  [facility] or xray: Walk in 8:30-4:30 during weekdays, no appointment needed [address]  [place], [place] [address]  Blood pressure is well controlled today. Stay off losartan  at this time.  Thank you for coming in today. No change in medications at this time. If there are any concerns on your bloodwork, I will let you know. Take care!     Signed,   Reyes Pines, MD [facility], [facility] 03/06/24 11:17 AM

## 2024-03-06 NOTE — Patient Instructions (Addendum)
  Repeat chest xray in next week  [facility] or xray: Walk in 8:30-4:30 during weekdays, no appointment needed [address]  [place], [place] [address]  Blood pressure is well controlled today. Stay off losartan  at this time.  Thank you for coming in today. No change in medications at this time. If there are any concerns on your bloodwork, I will let you know. Take care!

## 2024-03-11 ENCOUNTER — Ambulatory Visit: Payer: Self-pay | Admitting: Family Medicine

## 2024-03-13 ENCOUNTER — Ambulatory Visit: Payer: Self-pay | Admitting: Family Medicine

## 2024-03-13 ENCOUNTER — Ambulatory Visit (INDEPENDENT_AMBULATORY_CARE_PROVIDER_SITE_OTHER)
Admission: RE | Admit: 2024-03-13 | Discharge: 2024-03-13 | Disposition: A | Discharge status: Home / Self Care | Source: Ambulatory Visit | Attending: Family Medicine | Admitting: Family Medicine

## 2024-03-13 DIAGNOSIS — J189 Pneumonia, unspecified organism: Secondary | ICD-10-CM | POA: Diagnosis not present

## 2024-03-13 NOTE — Progress Notes (Signed)
 Called patient to relay results. Left vm to return call

## 2024-03-14 NOTE — Telephone Encounter (Signed)
 Copied from CRM #[id number]. Topic: General - Call Back - No Documentation >> Mar 14, 2024  4:04 PM Rea ORN wrote: Reason for CRM: pt called CMA back. Pt was given results from [facility] Chest 2 View results encounter.

## 2024-03-14 NOTE — Progress Notes (Signed)
 noted

## 2024-03-14 NOTE — Progress Notes (Signed)
 Called patient to relay lab results. Left vm to return call

## 2024-05-04 ENCOUNTER — Other Ambulatory Visit: Payer: Self-pay | Admitting: Family Medicine

## 2024-05-04 DIAGNOSIS — E782 Mixed hyperlipidemia: Secondary | ICD-10-CM

## 2024-05-20 ENCOUNTER — Other Ambulatory Visit: Payer: Self-pay | Admitting: Family Medicine

## 2024-05-20 DIAGNOSIS — E782 Mixed hyperlipidemia: Secondary | ICD-10-CM

## 2024-05-20 MED ORDER — ROSUVASTATIN CALCIUM 10 MG PO TABS: 10.0000 mg | ORAL_TABLET | Freq: Every day | ORAL | 3 refills | Status: AC

## 2024-05-20 NOTE — Telephone Encounter (Signed)
 Copied from CRM [id number]. Topic: Clinical - Medication Refill >> May 20, 2024  8:40 AM Viola F wrote: Medication: rosuvastatin  (CRESTOR ) 10 MG tablet [[id number]]  Has the patient contacted their pharmacy? Yes (Agent: If no, request that the patient contact the pharmacy for the refill. If patient does not wish to contact the pharmacy document the reason why and proceed with request.) (Agent: If yes, when and what did the pharmacy advise?)  This is the patient's preferred pharmacy:  [id number] - [place], [place] - [address] AT [facility] OF [address] [place] [address] Phone: [phone] Fax: [phone]  [facility] [address] [place] - [address] [employer] [place] [address] Phone: [phone] Fax: [phone]  Is this the correct pharmacy for this prescription? Yes If no, delete pharmacy and type the correct one.   Has the prescription been filled recently? Yes  Is the patient out of the medication? Yes  Has the patient been seen for an appointment in the last year OR does the patient have an upcoming appointment? Yes  Can we respond through MyChart? No  Agent: Please be advised that Rx refills may take up to 3 business days. We ask that you follow-up with your pharmacy.

## 2024-07-20 ENCOUNTER — Other Ambulatory Visit: Payer: Self-pay | Admitting: Family Medicine

## 2024-07-20 DIAGNOSIS — I10 Essential (primary) hypertension: Secondary | ICD-10-CM

## 2024-09-04 ENCOUNTER — Encounter: Admitting: Family Medicine
